# Patient Record
Sex: Male | Born: 1980 | Race: Black or African American | Hispanic: No | Marital: Single | Smoking: Former smoker
Health system: Southern US, Community
[De-identification: ages and names within clinical notes are randomized; demographics above are authoritative.]

## PROBLEM LIST (undated history)

## (undated) DIAGNOSIS — I10 Essential (primary) hypertension: Secondary | ICD-10-CM

## (undated) DIAGNOSIS — J309 Allergic rhinitis, unspecified: Secondary | ICD-10-CM

## (undated) DIAGNOSIS — F0781 Postconcussional syndrome: Secondary | ICD-10-CM

## (undated) DIAGNOSIS — G4730 Sleep apnea, unspecified: Secondary | ICD-10-CM

## (undated) DIAGNOSIS — J069 Acute upper respiratory infection, unspecified: Secondary | ICD-10-CM

## (undated) DIAGNOSIS — F32A Depression, unspecified: Secondary | ICD-10-CM

## (undated) DIAGNOSIS — F4310 Post-traumatic stress disorder, unspecified: Secondary | ICD-10-CM

## (undated) DIAGNOSIS — S069X9A Unspecified intracranial injury with loss of consciousness of unspecified duration, initial encounter: Secondary | ICD-10-CM

## (undated) DIAGNOSIS — G43909 Migraine, unspecified, not intractable, without status migrainosus: Secondary | ICD-10-CM

## (undated) DIAGNOSIS — S069XAA Unspecified intracranial injury with loss of consciousness status unknown, initial encounter: Secondary | ICD-10-CM

## (undated) DIAGNOSIS — H4900 Third [oculomotor] nerve palsy, unspecified eye: Secondary | ICD-10-CM

## (undated) HISTORY — DX: Depression, unspecified: F32.A

## (undated) HISTORY — DX: Essential (primary) hypertension: I10

## (undated) HISTORY — PX: SHOULDER ACROMIOPLASTY: SHX6093

## (undated) HISTORY — DX: Allergic rhinitis, unspecified: J30.9

## (undated) HISTORY — DX: Post-traumatic stress disorder, unspecified: F43.10

## (undated) HISTORY — DX: Sleep apnea, unspecified: G47.30

## (undated) HISTORY — DX: Acute upper respiratory infection, unspecified: J06.9

## (undated) HISTORY — DX: Third (oculomotor) nerve palsy, unspecified eye: H49.00

---

## 2008-04-16 ENCOUNTER — Emergency Department (HOSPITAL_COMMUNITY): Admission: EM | Admit: 2008-04-16 | Discharge: 2008-04-16 | Payer: Self-pay | Admitting: Emergency Medicine

## 2013-12-08 ENCOUNTER — Encounter (HOSPITAL_COMMUNITY): Payer: Self-pay | Admitting: Emergency Medicine

## 2013-12-08 ENCOUNTER — Emergency Department (HOSPITAL_COMMUNITY): Payer: Worker's Compensation

## 2013-12-08 ENCOUNTER — Emergency Department (HOSPITAL_COMMUNITY)
Admission: EM | Admit: 2013-12-08 | Discharge: 2013-12-08 | Discharge status: Against Medical Advice | Payer: Worker's Compensation | Attending: Emergency Medicine | Admitting: Emergency Medicine

## 2013-12-08 DIAGNOSIS — Z87891 Personal history of nicotine dependence: Secondary | ICD-10-CM | POA: Diagnosis not present

## 2013-12-08 DIAGNOSIS — Y92410 Unspecified street and highway as the place of occurrence of the external cause: Secondary | ICD-10-CM | POA: Diagnosis not present

## 2013-12-08 DIAGNOSIS — S8990XA Unspecified injury of unspecified lower leg, initial encounter: Secondary | ICD-10-CM | POA: Insufficient documentation

## 2013-12-08 DIAGNOSIS — S99919A Unspecified injury of unspecified ankle, initial encounter: Secondary | ICD-10-CM | POA: Diagnosis not present

## 2013-12-08 DIAGNOSIS — Y9389 Activity, other specified: Secondary | ICD-10-CM | POA: Insufficient documentation

## 2013-12-08 DIAGNOSIS — S99929A Unspecified injury of unspecified foot, initial encounter: Principal | ICD-10-CM

## 2013-12-08 MED ORDER — METOCLOPRAMIDE HCL 5 MG/ML IJ SOLN
10.0000 mg | Freq: Once | INTRAMUSCULAR | Status: AC
  Administered 2013-12-08: 10 mg via INTRAVENOUS
  Filled 2013-12-08: qty 2

## 2013-12-08 MED ORDER — HYDROMORPHONE HCL PF 1 MG/ML IJ SOLN
1.0000 mg | Freq: Once | INTRAMUSCULAR | Status: AC
  Administered 2013-12-08: 1 mg via INTRAVENOUS

## 2013-12-08 MED ORDER — ONDANSETRON HCL 4 MG/2ML IJ SOLN
4.0000 mg | Freq: Once | INTRAMUSCULAR | Status: AC
  Administered 2013-12-08: 4 mg via INTRAVENOUS
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
## 2013-12-08 NOTE — ED Notes (Signed)
Dr. Shelle IronBeane paged about d/c MRI and to ask which splint Dr. Eulah CitizenWould like pt to have put on until office follow up tomorrow.

## 2013-12-08 NOTE — ED Notes (Signed)
Dr. Shelle IronBeane at bedside, states that MRI can be d/c, pt ankle is stable. Recommends splint by ortho and crutches and to follow up with Beane, ortho tomorrow.
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
## 2014-01-19 DIAGNOSIS — G43909 Migraine, unspecified, not intractable, without status migrainosus: Secondary | ICD-10-CM | POA: Insufficient documentation

## 2014-03-31 ENCOUNTER — Encounter: Payer: Self-pay | Admitting: Podiatry

## 2014-03-31 ENCOUNTER — Ambulatory Visit (INDEPENDENT_AMBULATORY_CARE_PROVIDER_SITE_OTHER): Payer: 59 | Admitting: Podiatry

## 2014-03-31 VITALS — BP 128/84 | HR 68 | Resp 16 | Ht 67.0 in | Wt 200.0 lb

## 2014-03-31 DIAGNOSIS — L600 Ingrowing nail: Secondary | ICD-10-CM

## 2014-03-31 MED ORDER — NEOMYCIN-POLYMYXIN-HC 1 % OT SOLN: OTIC | Status: DC

## 2014-03-31 NOTE — Patient Instructions (Signed)

## 2014-03-31 NOTE — Progress Notes (Signed)
   Subjective:    Patient ID: Justin [name], male    DOB: 10/27/1980, 33 y.o.   MRN: [id number]  HPI Comments: "I have an ingrown toenail, I think"  Patient c/o tenderness 1st toe right, lateral border, for a couple days. The area has gradually gotten red and swollen and slight drainage. Sore with pressure. He has clipped out the nail some.  Toe Pain       Review of Systems  All other systems reviewed and are negative.      Objective:   Physical Exam: I have reviewed his past medical history medications allergies surgeries social history and review of systems. Pulses are strongly palpable right foot. Neurologic sensorium is intact per Semmes-Weinstein monofilament. Deep tendon reflexes are brisk and equal right foot. Muscle strength is 5 over 5 dorsiflexors plantar flexors inverters everters all intrinsic musculature is intact. Orthopedic evaluation demonstrates all joints distal to the ankle a full range of motion without crepitation. Cutaneous evaluation demonstrates a sharp incurvated nail margins with erythema and purulence to the fibular border of the hallux right. 6 was only tender on palpation.        Assessment & Plan:  Assessment: Ingrown nail paronychia abscess hallux right fibular border.  Plan: Discussed etiology pathology conservative versus surgical therapies. At this point we performed a chemical matrixectomy after local anesthesia was achieved. He tolerated procedure well with 3 applications of phenol to the fibular border and the exposed nail root. A dressed a compressive dressing was applied. He was given both oral and written home-going instructions for the care of maintenance of his surgical site. I will followup with him in one week. Should he have questions or concerns she will notify us immediately.

## 2014-04-21 ENCOUNTER — Ambulatory Visit (INDEPENDENT_AMBULATORY_CARE_PROVIDER_SITE_OTHER): Payer: 59 | Admitting: Podiatry

## 2014-04-21 ENCOUNTER — Encounter: Payer: Self-pay | Admitting: Podiatry

## 2014-04-21 ENCOUNTER — Ambulatory Visit: Payer: Self-pay | Admitting: Podiatry

## 2014-04-21 DIAGNOSIS — L600 Ingrowing nail: Secondary | ICD-10-CM

## 2014-04-21 NOTE — Progress Notes (Signed)
He presents today for followup of a matrixectomy fibular border hallux right. He denies fever chills nausea vomiting muscle aches and pains. Continues to soak in Betadine and water.  Objective: Vital signs are stable he is alert and oriented x3. There is no erythema edema cellulitis drainage or odor to the fibular border of the hallux right. It is nontender on palpation a small eschar is present.  Assessment: Well-healing surgical ingrown toenail deformity right foot.  Plan: Stop Betadine soaks start with Epsom salts in warm water twice daily apply Cortisporin as directed. Followup with me as needed.

## 2014-04-26 ENCOUNTER — Ambulatory Visit: Payer: Self-pay | Admitting: Podiatry

## 2015-02-20 ENCOUNTER — Emergency Department (HOSPITAL_BASED_OUTPATIENT_CLINIC_OR_DEPARTMENT_OTHER)
Admission: EM | Admit: 2015-02-20 | Discharge: 2015-02-20 | Disposition: A | Discharge status: Home / Self Care | Payer: Worker's Compensation | Attending: Emergency Medicine | Admitting: Emergency Medicine

## 2015-02-20 ENCOUNTER — Emergency Department (HOSPITAL_BASED_OUTPATIENT_CLINIC_OR_DEPARTMENT_OTHER): Payer: Worker's Compensation

## 2015-02-20 ENCOUNTER — Encounter (HOSPITAL_BASED_OUTPATIENT_CLINIC_OR_DEPARTMENT_OTHER): Payer: Self-pay | Admitting: *Deleted

## 2015-02-20 DIAGNOSIS — W1839XA Other fall on same level, initial encounter: Secondary | ICD-10-CM | POA: Insufficient documentation

## 2015-02-20 DIAGNOSIS — Y9389 Activity, other specified: Secondary | ICD-10-CM | POA: Diagnosis not present

## 2015-02-20 DIAGNOSIS — S4992XA Unspecified injury of left shoulder and upper arm, initial encounter: Secondary | ICD-10-CM | POA: Diagnosis present

## 2015-02-20 DIAGNOSIS — Y9289 Other specified places as the place of occurrence of the external cause: Secondary | ICD-10-CM | POA: Insufficient documentation

## 2015-02-20 DIAGNOSIS — Z87891 Personal history of nicotine dependence: Secondary | ICD-10-CM | POA: Diagnosis not present

## 2015-02-20 DIAGNOSIS — Y998 Other external cause status: Secondary | ICD-10-CM | POA: Insufficient documentation

## 2015-02-20 DIAGNOSIS — S46912A Strain of unspecified muscle, fascia and tendon at shoulder and upper arm level, left arm, initial encounter: Secondary | ICD-10-CM | POA: Insufficient documentation

## 2015-02-20 MED ORDER — NAPROXEN 500 MG PO TABS: 500.0000 mg | ORAL_TABLET | Freq: Two times a day (BID) | ORAL | Status: DC

## 2015-02-20 MED ORDER — KETOROLAC TROMETHAMINE 60 MG/2ML IM SOLN
60.0000 mg | Freq: Once | INTRAMUSCULAR | Status: AC
  Administered 2015-02-20: 60 mg via INTRAMUSCULAR
  Filled 2015-02-20: qty 2

## 2015-02-20 MED ORDER — TRAMADOL HCL 50 MG PO TABS: 50.0000 mg | ORAL_TABLET | Freq: Four times a day (QID) | ORAL | Status: DC | PRN

## 2015-02-20 NOTE — ED Notes (Signed)
Patient transported to X-ray 
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
## 2015-02-20 NOTE — Discharge Instructions (Signed)
Ice your shoulder several times a day. Keep in the sling for comfort and support. Naprosyn for pain and inflammation. Ultram for severe pain. Follow up with orthopedics specialist.   Shoulder Pain The shoulder is the joint that connects your arms to your body. The bones that form the shoulder joint include the upper arm bone (humerus), the shoulder blade (scapula), and the collarbone (clavicle). The top of the humerus is shaped like a ball and fits into a rather flat socket on the scapula (glenoid cavity). A combination of muscles and strong, fibrous tissues that connect muscles to bones (tendons) support your shoulder joint and hold the ball in the socket. Small, fluid-filled sacs (bursae) are located in different areas of the joint. They act as cushions between the bones and the overlying soft tissues and help reduce friction between the gliding tendons and the bone as you move your arm. Your shoulder joint allows a wide range of motion in your arm. This range of motion allows you to do things like scratch your back or throw a ball. However, this range of motion also makes your shoulder more prone to pain from overuse and injury. Causes of shoulder pain can originate from both injury and overuse and usually can be grouped in the following four categories:  Redness, swelling, and pain (inflammation) of the tendon (tendinitis) or the bursae (bursitis).  Instability, such as a dislocation of the joint.  Inflammation of the joint (arthritis).  Broken bone (fracture). HOME CARE INSTRUCTIONS   Apply ice to the sore area.  Put ice in a plastic bag.  Place a towel between your skin and the bag.  Leave the ice on for 15-20 minutes, 3-4 times per day for the first 2 days, or as directed by your health care provider.  Stop using cold packs if they do not help with the pain.  If you have a shoulder sling or immobilizer, wear it as long as your caregiver instructs. Only remove it to shower or bathe.  Move your arm as little as possible, but keep your hand moving to prevent swelling.  Squeeze a soft ball or foam pad as much as possible to help prevent swelling.  Only take over-the-counter or prescription medicines for pain, discomfort, or fever as directed by your caregiver. SEEK MEDICAL CARE IF:   Your shoulder pain increases, or new pain develops in your arm, hand, or fingers.  Your hand or fingers become cold and numb.  Your pain is not relieved with medicines. SEEK IMMEDIATE MEDICAL CARE IF:   Your arm, hand, or fingers are numb or tingling.  Your arm, hand, or fingers are significantly swollen or turn white or blue. MAKE SURE YOU:   Understand these instructions.  Will watch your condition.  Will get help right away if you are not doing well or get worse. Document Released: 06/19/2005 Document Revised: 01/24/2014 Document Reviewed: 08/24/2011 [facility] Patient Information 2015 [employer] [place]. This information is not intended to replace advice given to you by your health care provider. Make sure you discuss any questions you have with your health care provider.
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
## 2015-05-30 ENCOUNTER — Emergency Department (HOSPITAL_BASED_OUTPATIENT_CLINIC_OR_DEPARTMENT_OTHER)
Admission: EM | Admit: 2015-05-30 | Discharge: 2015-05-30 | Disposition: A | Discharge status: Home / Self Care | Payer: Non-veteran care | Attending: Emergency Medicine | Admitting: Emergency Medicine

## 2015-05-30 ENCOUNTER — Encounter (HOSPITAL_BASED_OUTPATIENT_CLINIC_OR_DEPARTMENT_OTHER): Payer: Self-pay | Admitting: *Deleted

## 2015-05-30 DIAGNOSIS — G43009 Migraine without aura, not intractable, without status migrainosus: Secondary | ICD-10-CM | POA: Insufficient documentation

## 2015-05-30 DIAGNOSIS — Z791 Long term (current) use of non-steroidal anti-inflammatories (NSAID): Secondary | ICD-10-CM | POA: Insufficient documentation

## 2015-05-30 DIAGNOSIS — Z87891 Personal history of nicotine dependence: Secondary | ICD-10-CM | POA: Insufficient documentation

## 2015-05-30 DIAGNOSIS — R51 Headache: Secondary | ICD-10-CM | POA: Diagnosis present

## 2015-05-30 HISTORY — DX: Migraine, unspecified, not intractable, without status migrainosus: G43.909

## 2015-05-30 MED ORDER — DEXAMETHASONE SODIUM PHOSPHATE 10 MG/ML IJ SOLN
10.0000 mg | Freq: Once | INTRAMUSCULAR | Status: AC
  Administered 2015-05-30: 10 mg via INTRAVENOUS
  Filled 2015-05-30: qty 1

## 2015-05-30 MED ORDER — PROCHLORPERAZINE EDISYLATE 5 MG/ML IJ SOLN
10.0000 mg | Freq: Four times a day (QID) | INTRAMUSCULAR | Status: DC | PRN
  Administered 2015-05-30: 10 mg via INTRAVENOUS
  Filled 2015-05-30: qty 2

## 2015-05-30 NOTE — ED Notes (Addendum)
Patient states he has a history of migraines.  States this headache has lasted for the last seven days.  Has taken Excedrin migraine with no relief.  Today he took Ibuprofen  with no relief.   Headache has been associated with vomiting last night.

## 2015-05-30 NOTE — Discharge Instructions (Signed)
Recurrent Migraine Headache A migraine headache is an intense, throbbing pain on one or both sides of your head. Recurrent migraines keep coming back. A migraine can last for 30 minutes to several hours. CAUSES  The exact cause of a migraine headache is not always known. However, a migraine may be caused when nerves in the brain become irritated and release chemicals that cause inflammation. This causes pain. Certain things may also trigger migraines, such as:   Alcohol.  Smoking.  Stress.  Menstruation.  Aged cheeses.  Foods or drinks that contain nitrates, glutamate, aspartame, or tyramine.  Lack of sleep.  Chocolate.  Caffeine.  Hunger.  Physical exertion.  Fatigue.  Medicines used to treat chest pain (nitroglycerine), birth control pills, estrogen, and some blood pressure medicines. SYMPTOMS   Pain on one or both sides of your head.  Pulsating or throbbing pain.  Severe pain that prevents daily activities.  Pain that is aggravated by any physical activity.  Nausea, vomiting, or both.  Dizziness.  Pain with exposure to bright lights, loud noises, or activity.  General sensitivity to bright lights, loud noises, or smells. Before you get a migraine, you may get warning signs that a migraine is coming (aura). An aura may include:  Seeing flashing lights.  Seeing bright spots, halos, or zigzag lines.  Having tunnel vision or blurred vision.  Having feelings of numbness or tingling.  Having trouble talking.  Having muscle weakness. DIAGNOSIS  A recurrent migraine headache is often diagnosed based on:  Symptoms.  Physical examination.  A CT scan or MRI of your head. These imaging tests cannot diagnose migraines but can help rule out other causes of headaches.  TREATMENT  Medicines may be given for pain and nausea. Medicines can also be given to help prevent recurrent migraines. HOME CARE INSTRUCTIONS  Only take over-the-counter or prescription  medicines for pain or discomfort as directed by your health care provider. The use of long-term narcotics is not recommended.  Lie down in a dark, quiet room when you have a migraine.  Keep a journal to find out what may trigger your migraine headaches. For example, write down:  What you eat and drink.  How much sleep you get.  Any change to your diet or medicines.  Limit alcohol consumption.  Quit smoking if you smoke.  Get 7-9 hours of sleep, or as recommended by your health care provider.  Limit stress.  Keep lights dim if bright lights bother you and make your migraines worse. SEEK MEDICAL CARE IF:   You do not get relief from the medicines given to you.  You have a recurrence of pain.  You have a fever. SEEK IMMEDIATE MEDICAL CARE IF:  Your migraine becomes severe.  You have a stiff neck.  You have loss of vision.  You have muscular weakness or loss of muscle control.  You start losing your balance or have trouble walking.  You feel faint or pass out.  You have severe symptoms that are different from your first symptoms. MAKE SURE YOU:   Understand these instructions.  Will watch your condition.  Will get help right away if you are not doing well or get worse. Document Released: 06/04/2001 Document Revised: 01/24/2014 Document Reviewed: 05/17/2013 Exit[employer] Patient Information 2015 [employer]. This information is not intended to replace advice given to you by your health care provider. Make sure you discuss any questions you have with your health care provider.  

## 2015-05-30 NOTE — ED Provider Notes (Addendum)
CSN: [id number]     Arrival date & time 05/30/15  0919 History   First MD Initiated Contact with Patient 05/30/15 1010     Chief Complaint  Patient presents with  . Migraine     (Consider location/radiation/quality/duration/timing/severity/associated sxs/prior Treatment) Patient is a 34 y.o. male presenting with migraines.  Migraine This is a new problem. Episode onset: 1 week. The problem occurs constantly. The problem has not changed since onset.Associated symptoms include headaches (from back of head to right, hx of TBI when in [place], better with massage, throbbing and sharp). Pertinent negatives include no chest pain, no abdominal pain and no shortness of breath. Exacerbated by: bright lights. Treatments tried: ibuprofen, excedrin migraine. The treatment provided no relief.    Past Medical History  Diagnosis Date  . Migraine    Past Surgical History  Procedure Laterality Date  . Shoulder acromioplasty     No family history on file. Social History  Substance Use Topics  . Smoking status: Former Games developer  . Smokeless tobacco: None  . Alcohol Use: Yes     Comment: social    Review of Systems  Constitutional: Negative for fever.  HENT: Negative for sore throat.   Eyes: Positive for photophobia and visual disturbance.  Respiratory: Negative for shortness of breath.   Cardiovascular: Negative for chest pain.  Gastrointestinal: Positive for nausea and vomiting. Negative for abdominal pain, diarrhea and constipation.  Genitourinary: Negative for difficulty urinating.  Musculoskeletal: Negative for back pain and neck stiffness.  Skin: Negative for rash.  Neurological: Positive for headaches (from back of head to right, hx of TBI when in [place], better with massage, throbbing and sharp). Negative for dizziness, tremors, syncope, facial asymmetry, speech difficulty, weakness, light-headedness and numbness.      Allergies  Shrimp and Iodine  Home Medications   Prior to Admission  medications   Medication Sig Start Date End Date Taking? Authorizing Provider  aspirin-acetaminophen-caffeine (EXCEDRIN MIGRAINE) [phone] MG per tablet Take 2 tablets by mouth every 6 (six) hours as needed for headache.   Yes Historical Provider, MD  naproxen (NAPROSYN) 500 MG tablet Take 1 tablet (500 mg total) by mouth 2 (two) times daily. 02/20/15   Tatyana Kirichenko, PA-C  NEOMYCIN-POLYMYXIN-HYDROCORTISONE (CORTISPORIN) 1 % SOLN otic solution Apply 1-2 drops to the toe after soaking BID 03/31/14   Max T Hyatt, DPM  traMADol (ULTRAM) 50 MG tablet Take 1 tablet (50 mg total) by mouth every 6 (six) hours as needed. 02/20/15   Tatyana Kirichenko, PA-C   BP 118/80 mmHg  Pulse 68  Temp(Src) 98.1 F (36.7 C) (Oral)  Resp 20  Ht 5\' 8"  (1.727 m)  Wt 200 lb (90.719 kg)  BMI 30.42 kg/m2  SpO2 100% Physical Exam  Constitutional: He is oriented to person, place, and time. He appears well-developed and well-nourished. No distress.  HENT:  Head: Normocephalic and atraumatic.  Eyes: Conjunctivae and EOM are normal.  Neck: Normal range of motion.  Cardiovascular: Normal rate, regular rhythm, normal heart sounds and intact distal pulses.  Exam reveals no gallop and no friction rub.   No murmur heard. Pulmonary/Chest: Effort normal and breath sounds normal. No respiratory distress. He has no wheezes. He has no rales.  Abdominal: Soft. He exhibits no distension. There is no tenderness. There is no guarding.  Musculoskeletal: He exhibits no edema.  Neurological: He is alert and oriented to person, place, and time. He has normal strength. No cranial nerve deficit or sensory deficit. Coordination and gait normal. GCS  eye subscore is 4. GCS verbal subscore is 5. GCS motor subscore is 6.  Skin: Skin is warm and dry. He is not diaphoretic.  Nursing note and vitals reviewed.   ED Course  Procedures (including critical care time) Labs Review Labs Reviewed - No data to display  Imaging Review No results  found. I have personally reviewed and evaluated these images and lab results as part of my medical decision-making.   EKG Interpretation None      MDM   Final diagnoses:  Migraine without aura and without status migrainosus, not intractable   34yo male with history of migraines presents with concern of headache.  Headache began slowly, no trauma, no fevers, and normal neurologic exam and have low suspicion for [facility], SDH or meningitis.  Patient was given compazine and decadron with improvement in headache.  Patient discharged in stable condition with understanding of reasons to return.    Alvira Monday, MD 05/30/15 [phone]

## 2016-07-18 ENCOUNTER — Emergency Department (HOSPITAL_BASED_OUTPATIENT_CLINIC_OR_DEPARTMENT_OTHER): Payer: Non-veteran care

## 2016-07-18 ENCOUNTER — Emergency Department (HOSPITAL_BASED_OUTPATIENT_CLINIC_OR_DEPARTMENT_OTHER)
Admission: EM | Admit: 2016-07-18 | Discharge: 2016-07-18 | Disposition: A | Discharge status: Home / Self Care | Payer: Non-veteran care | Attending: Emergency Medicine | Admitting: Emergency Medicine

## 2016-07-18 ENCOUNTER — Encounter (HOSPITAL_BASED_OUTPATIENT_CLINIC_OR_DEPARTMENT_OTHER): Payer: Self-pay

## 2016-07-18 DIAGNOSIS — Z7982 Long term (current) use of aspirin: Secondary | ICD-10-CM | POA: Insufficient documentation

## 2016-07-18 DIAGNOSIS — G43809 Other migraine, not intractable, without status migrainosus: Secondary | ICD-10-CM | POA: Diagnosis not present

## 2016-07-18 DIAGNOSIS — G43909 Migraine, unspecified, not intractable, without status migrainosus: Secondary | ICD-10-CM | POA: Diagnosis present

## 2016-07-18 HISTORY — DX: Unspecified intracranial injury with loss of consciousness of unspecified duration, initial encounter: S06.9X9A

## 2016-07-18 HISTORY — DX: Unspecified intracranial injury with loss of consciousness status unknown, initial encounter: S06.9XAA

## 2016-07-18 HISTORY — DX: Postconcussional syndrome: F07.81

## 2016-07-18 LAB — CBC WITH DIFFERENTIAL/PLATELET
BASOS ABS: 0 10*3/uL (ref 0.0–0.1)
Basophils Relative: 0 %
EOS PCT: 1 %
Eosinophils Absolute: 0.1 10*3/uL (ref 0.0–0.7)
HEMATOCRIT: 43.6 % (ref 39.0–52.0)
Hemoglobin: 15.1 g/dL (ref 13.0–17.0)
LYMPHS ABS: 1.4 10*3/uL (ref 0.7–4.0)
LYMPHS PCT: 22 %
MCH: 31 pg (ref 26.0–34.0)
MCHC: 34.6 g/dL (ref 30.0–36.0)
MCV: 89.5 fL (ref 78.0–100.0)
MONO ABS: 0.3 10*3/uL (ref 0.1–1.0)
MONOS PCT: 4 %
NEUTROS ABS: 4.6 10*3/uL (ref 1.7–7.7)
Neutrophils Relative %: 73 %
PLATELETS: 335 10*3/uL (ref 150–400)
RBC: 4.87 MIL/uL (ref 4.22–5.81)
RDW: 13 % (ref 11.5–15.5)
WBC: 6.3 10*3/uL (ref 4.0–10.5)

## 2016-07-18 LAB — BASIC METABOLIC PANEL
ANION GAP: 7 (ref 5–15)
BUN: 16 mg/dL (ref 6–20)
CALCIUM: 9.3 mg/dL (ref 8.9–10.3)
CO2: 28 mmol/L (ref 22–32)
Chloride: 103 mmol/L (ref 101–111)
Creatinine, Ser: 1.31 mg/dL — ABNORMAL HIGH (ref 0.61–1.24)
GFR calc Af Amer: >60 mL/min (ref >60)
GLUCOSE: 105 mg/dL — AB (ref 65–99)
Potassium: 3.7 mmol/L (ref 3.5–5.1)
Sodium: 138 mmol/L (ref 135–145)

## 2016-07-18 MED ORDER — SODIUM CHLORIDE 0.9 % IV BOLUS (SEPSIS)
1000.0000 mL | Freq: Once | INTRAVENOUS | Status: AC
  Administered 2016-07-18: 1000 mL via INTRAVENOUS

## 2016-07-18 MED ORDER — PROCHLORPERAZINE EDISYLATE 5 MG/ML IJ SOLN
10.0000 mg | Freq: Once | INTRAMUSCULAR | Status: AC
Start: 2016-07-18 — End: 2016-07-18
  Administered 2016-07-18: 10 mg via INTRAVENOUS
  Filled 2016-07-18: qty 2

## 2016-07-18 MED ORDER — KETOROLAC TROMETHAMINE 30 MG/ML IJ SOLN
30.0000 mg | Freq: Once | INTRAMUSCULAR | Status: AC
  Administered 2016-07-18: 30 mg via INTRAVENOUS
  Filled 2016-07-18: qty 1

## 2016-07-18 MED ORDER — MAGNESIUM SULFATE 2 GM/50ML IV SOLN
2.0000 g | Freq: Once | INTRAVENOUS | Status: AC
  Administered 2016-07-18: 2 g via INTRAVENOUS
  Filled 2016-07-18: qty 50

## 2016-07-18 MED ORDER — SUMATRIPTAN SUCCINATE 6 MG/0.5ML ~~LOC~~ SOLN: 6.0000 mg | Freq: Once | SUBCUTANEOUS | Status: DC

## 2016-07-18 MED ORDER — METHYLPREDNISOLONE SODIUM SUCC 125 MG IJ SOLR
125.0000 mg | Freq: Once | INTRAMUSCULAR | Status: AC
  Administered 2016-07-18: 125 mg via INTRAVENOUS
  Filled 2016-07-18: qty 2

## 2016-07-18 MED ORDER — BUPIVACAINE HCL 0.5 % IJ SOLN
10.0000 mL | Freq: Once | INTRAMUSCULAR | Status: AC
  Administered 2016-07-18: 10 mL
  Filled 2016-07-18: qty 1

## 2016-07-18 NOTE — ED Triage Notes (Signed)
C/o intermittent migraine x 72 hours-NAD-steady gait

## 2016-07-18 NOTE — ED Provider Notes (Signed)
[facility]   CSN: [id number] Arrival date & time: 07/18/16  1123     History   Chief Complaint Chief Complaint  Patient presents with  . Migraine    HPI  Blood pressure 128/73, pulse 89, temperature 98.7 F (37.1 C), temperature source Oral, resp. rate 16, height 5\' 8"  (1.727 m), weight 104.3 kg, SpO2 100 %.  Justin [name] is a 35 y.o. male with past medical history significant for migraine status post TBI, he is followed at the [facility] for this. He started having a migraine exacerbation onset 7 days ago (note that this contradicts triage note) it's been intermittent, not alleviated by his Thorazine and Excedrin. It is right periorbital, typical in location and severity but atypical in the duration of his symptoms for 7 days. He has had intermittent relief and that time. Positive photosensitivity and 4 episodes of emesis at the onset of headache which is not atypical for him. He also has associated Mnire's disease with tinnitus and balance problems that are not exacerbated over the last week. Pt denies fever, rash, confusion, cervicalgia, LOC/syncope, change in vision, N/V, numbness, weakness, dysarthria, ataxia, thunderclap onset, exacerbation with exertion or valsalva, exacerbation in morning, CP, SOB, abdominal pain.   Past Medical History:  Diagnosis Date  . Migraine   . Postconcussion syndrome   . TBI (traumatic brain injury) (HCC)     There are no active problems to display for this patient.   Past Surgical History:  Procedure Laterality Date  . SHOULDER ACROMIOPLASTY         Home Medications    Prior to Admission medications   Medication Sig Start Date End Date Taking? Authorizing Provider  aspirin-acetaminophen-caffeine (EXCEDRIN MIGRAINE) [phone] MG per tablet Take 2 tablets by mouth every 6 (six) hours as needed for headache.    Historical Provider, MD    Family History No family history on file.  Social History Social History    Substance Use Topics  . Smoking status: Never Smoker  . Smokeless tobacco: Never Used  . Alcohol use No     Allergies   Shrimp [shellfish allergy] and Iodine   Review of Systems Review of Systems   Physical Exam Updated Vital Signs BP 117/63   Pulse 68   Temp 98.7 F (37.1 C) (Oral)   Resp 18   Ht 5\' 8"  (1.727 m)   Wt 104.3 kg   SpO2 98%   BMI 34.97 kg/m   Physical Exam  Constitutional: He is oriented to person, place, and time. He appears well-developed and well-nourished.  sensitive to light  HENT:  Head: Normocephalic and atraumatic.  Mouth/Throat: Oropharynx is clear and moist.  Eyes: Conjunctivae and EOM are normal. Pupils are equal, round, and reactive to light.  No TTP of maxillary or frontal sinuses  No TTP or induration of temporal arteries bilaterally  Neck: Normal range of motion. Neck supple.  FROM to C-spine. Pt can touch chin to chest without discomfort. No TTP of midline cervical spine.   Cardiovascular: Normal rate, regular rhythm and intact distal pulses.   Pulmonary/Chest: Effort normal and breath sounds normal. No respiratory distress. He has no wheezes. He has no rales. He exhibits no tenderness.  Abdominal: Soft. Bowel sounds are normal. There is no tenderness.  Musculoskeletal: Normal range of motion. He exhibits no edema or tenderness.  Neurological: He is alert and oriented to person, place, and time. No cranial nerve deficit.  II-Visual fields grossly intact. III/IV/VI-Extraocular movements intact.  Pupils reactive bilaterally. V/VII-Smile symmetric, equal eyebrow raise,  facial sensation intact VIII- Hearing grossly intact IX/X-Normal gag XI-bilateral shoulder shrug XII-midline tongue extension Motor: 5/5 bilaterally with normal tone and bulk Cerebellar: Normal finger-to-nose  and normal heel-to-shin test.   Romberg negative Ambulates with a coordinated gait   Nursing note and vitals reviewed.    ED Treatments / Results   Labs (all labs ordered are listed, but only abnormal results are displayed) Labs Reviewed  BASIC METABOLIC PANEL - Abnormal; Notable for the following:       Result Value   Glucose, Bld 105 (*)    Creatinine, Ser 1.31 (*)    All other components within normal limits  CBC WITH DIFFERENTIAL/PLATELET    EKG  EKG Interpretation None       Radiology Ct Head Wo Contrast  Result Date: 07/18/2016 CLINICAL DATA:  Intermittent migraine for 7 days EXAM: CT HEAD WITHOUT CONTRAST TECHNIQUE: Contiguous axial images were obtained from the base of the skull through the vertex without intravenous contrast. COMPARISON:  None. FINDINGS: Brain: No evidence of acute infarction, hemorrhage, hydrocephalus, extra-axial collection or mass lesion/mass effect. Vascular: No hyperdense vessel or unexpected calcification. Skull: Normal. Negative for fracture or focal lesion. Sinuses/Orbits: No acute finding. Other: None. IMPRESSION: No acute intracranial abnormality. No definite acute cortical infarction. No hydrocephalus. Electronically Signed   By: Natasha Mead M.D.   On: 07/18/2016 13:18    Procedures .Nerve Block Date/Time: 07/18/2016 2:53 PM Performed by: Wynetta Emery Authorized by: Wynetta Emery   Consent:    Consent obtained:  Verbal   Consent given by:  Patient Indications:    Indications:  Pain relief Location:    Body area:  Head   Head nerve blocked: sphenopalatine.   Laterality:  Right Procedure details (see MAR for exact dosages):    Needle gauge: no needle used.   Anesthetic injected:  Bupivacaine 0.5% w/o epi   Steroid injected:  None   Additive injected:  None   Block injection procedure: cotton-tipped swab inserted into the right nares.   Paresthesia:  None Post-procedure details:    Dressing:  None   Patient tolerance of procedure:  Tolerated well, no immediate complications   (including critical care time)  Medications Ordered in ED Medications  sodium chloride  0.9 % bolus 1,000 mL (0 mLs Intravenous Stopped 07/18/16 1345)  methylPREDNISolone sodium succinate (SOLU-MEDROL) 125 mg/2 mL injection 125 mg (125 mg Intravenous Given 07/18/16 1258)  prochlorperazine (COMPAZINE) injection 10 mg (10 mg Intravenous Given 07/18/16 1300)  ketorolac (TORADOL) 30 MG/ML injection 30 mg (30 mg Intravenous Given 07/18/16 1342)  magnesium sulfate IVPB 2 g 50 mL (2 g Intravenous New Bag/Given 07/18/16 1420)  bupivacaine (MARCAINE) 0.5 % (with pres) injection 10 mL (10 mLs Infiltration Given 07/18/16 1433)     Initial Impression / Assessment and Plan / ED Course  I have reviewed the triage vital signs and the nursing notes.  Pertinent labs & imaging results that were available during my care of the patient were reviewed by me and considered in my medical decision making (see chart for details).  Clinical Course    Vitals:   07/18/16 1133 07/18/16 1323  BP: 128/73 117/63  Pulse: 89 68  Resp: 16 18  Temp: 98.7 F (37.1 C)   TempSrc: Oral   SpO2: 100% 98%  Weight: 104.3 kg   Height: 5\' 8"  (1.727 m)     Medications  sodium chloride 0.9 % bolus 1,000 mL (0 mLs Intravenous  Stopped 07/18/16 1345)  methylPREDNISolone sodium succinate (SOLU-MEDROL) 125 mg/2 mL injection 125 mg (125 mg Intravenous Given 07/18/16 1258)  prochlorperazine (COMPAZINE) injection 10 mg (10 mg Intravenous Given 07/18/16 1300)  ketorolac (TORADOL) 30 MG/ML injection 30 mg (30 mg Intravenous Given 07/18/16 1342)  magnesium sulfate IVPB 2 g 50 mL (2 g Intravenous New Bag/Given 07/18/16 1420)  bupivacaine (MARCAINE) 0.5 % (with pres) injection 10 mL (10 mLs Infiltration Given 07/18/16 1433)    Justin [name] is 35 y.o. male presenting with exacerbation of chronic migraine status post TBI. He states that this is typical for him in location and severity however only does not last this long. Patient afebrile, overall well-appearing. Neurologic exam is nonfocal, no red flags for subarachnoid. Given  the length of time he's had his headaches will obtain CT and give headache cocktail.  CT negative, headache cocktail with some improvement, rates his headache at 4 out of 10. Will give 2 g of magnesium and sphenopalatine block.  Headache significantly improved. He rates it 2 out of 10. I've offered him narcotics for rescue but he declines. Will follow with his neurologist in the week.  Evaluation does not show pathology that would require ongoing emergent intervention or inpatient treatment. Pt is hemodynamically stable and mentating appropriately. Discussed findings and plan with patient/guardian, who agrees with care plan. All questions answered. Return precautions discussed and outpatient follow up given.      Final Clinical Impressions(s) / ED Diagnoses   Final diagnoses:  Other migraine without status migrainosus, not intractable    New Prescriptions New Prescriptions   No medications on file     Wynetta Emery, PA-C 07/18/16 1535    Maia Plan, MD 07/18/16 [phone]

## 2016-07-18 NOTE — ED Notes (Signed)
Patient transported to CT 

## 2016-07-18 NOTE — Discharge Instructions (Signed)
Rest and stay well hydrated. Please follow with your neurologist in the next week.  Do not hesitate to return to the emergency room for a new, worsening or concerning symptoms.

## 2016-09-25 DIAGNOSIS — F4310 Post-traumatic stress disorder, unspecified: Secondary | ICD-10-CM | POA: Insufficient documentation

## 2016-09-25 DIAGNOSIS — G44309 Post-traumatic headache, unspecified, not intractable: Secondary | ICD-10-CM | POA: Insufficient documentation

## 2020-04-27 ENCOUNTER — Other Ambulatory Visit: Payer: Self-pay

## 2020-04-27 ENCOUNTER — Encounter: Payer: Self-pay | Admitting: Allergy and Immunology

## 2020-04-27 ENCOUNTER — Ambulatory Visit (INDEPENDENT_AMBULATORY_CARE_PROVIDER_SITE_OTHER): Payer: No Typology Code available for payment source | Admitting: Allergy and Immunology

## 2020-04-27 VITALS — BP 120/80 | HR 78 | Temp 98.4°F | Resp 16 | Ht 66.69 in | Wt 237.4 lb

## 2020-04-27 DIAGNOSIS — H1013 Acute atopic conjunctivitis, bilateral: Secondary | ICD-10-CM | POA: Diagnosis not present

## 2020-04-27 DIAGNOSIS — H1010 Acute atopic conjunctivitis, unspecified eye: Secondary | ICD-10-CM | POA: Insufficient documentation

## 2020-04-27 DIAGNOSIS — T7800XA Anaphylactic reaction due to unspecified food, initial encounter: Secondary | ICD-10-CM

## 2020-04-27 DIAGNOSIS — T781XXA Other adverse food reactions, not elsewhere classified, initial encounter: Secondary | ICD-10-CM | POA: Insufficient documentation

## 2020-04-27 DIAGNOSIS — T781XXD Other adverse food reactions, not elsewhere classified, subsequent encounter: Secondary | ICD-10-CM

## 2020-04-27 DIAGNOSIS — J3089 Other allergic rhinitis: Secondary | ICD-10-CM | POA: Insufficient documentation

## 2020-04-27 HISTORY — DX: Other allergic rhinitis: J30.89

## 2020-04-27 MED ORDER — EPINEPHRINE 0.15 MG/0.3ML IJ SOAJ: INTRAMUSCULAR | 3 refills | Status: DC

## 2020-04-27 NOTE — Assessment & Plan Note (Addendum)
   Treatment plan as outlined above for allergic rhinitis.  Ketotifen (Zaditor) 1 drop per eye twice daily as needed.  I have also recommended eye lubricant drops (i.e., Natural Tears) as needed.

## 2020-04-27 NOTE — Assessment & Plan Note (Signed)
The patient's history suggests shellfish allergy and positive skin test results today confirm this diagnosis.  Careful avoidance of shellfish as discussed.  A prescription has been provided for epinephrine auto-injector 2 pack along with instructions for proper administration.  A food allergy action plan has been provided and discussed.  Medic Alert identification is recommended.

## 2020-04-27 NOTE — Progress Notes (Signed)
New Patient Note  RE: Justin [name] MRN: [id number] DOB: [birth year] Date of Office Visit: 04/27/2020  Referring provider: Pecolia Ades, MD Primary care provider: Cleta Alberts, MD  Chief Complaint: Allergic Rhinitis  and Allergic Reaction   History of present illness: Justin [name] is a 39 y.o. male seen today in consultation requested by Pecolia Ades, MD.  He experiences nasal congestion, rhinorrhea, sneezing, postnasal drainage, nasal pruritus, and ocular pruritus.  The symptoms occur year-round but are most frequent and severe with pollen exposure, particularly springtime.  Reports that if he has not taken antihistamines and mows the lawn or touches his dog or is licked by his dog, he develops small hives on his arms.  He does not experience concomitant angioedema, cardiopulmonary symptoms, or GI symptoms. Justin [name] experiences oral pruritus with the consumption of fresh/raw apples, cantaloupe and, to a lesser extent, watermelon.  He does not experience concomitant urticaria, angioedema, cardiopulmonary symptoms, or other GI symptoms.  If the food is cooked, such as apple pie, he does not experience the oral pruritus. Justin [name] reports that when he was 22 or 39 years old he experienced lip swelling and a burning sensation of the lips when he consumed shrimp or crab.  He avoided shellfish for many years.  He states that he currently consumes some shellfish on occasion, but only while taking an antihistamine as the antihistamine seems to minimize any symptoms.   He has no history of symptoms consistent with asthma or atopic dermatitis.  Assessment and plan: Seasonal and perennial allergic rhinitis  Aeroallergen avoidance measures have been discussed and provided in written form.  Cetirizine 10 mg daily as needed.  To avoid diminishing benefit with daily use (tachyphylaxis) of second generation antihistamine, consider alternating every few months between fexofenadine (Allegra) and  cetirizine (Zyrtec).  For now, continue montelukast (Singulair) 10 mg daily at bedtime.  Ipratropium 0.3% nasal spray, 2 sprays per nostril twice daily as needed.  Fluticasone nasal spray, 1-2 sprays per nostril daily as needed.  Nasal saline spray (i.e., Simply Saline) or nasal saline lavage (i.e., NeilMed) is recommended as needed and prior to medicated nasal sprays.  The risks and benefits of aeroallergen immunotherapy have been discussed. The patient is motivated to initiate immunotherapy to reduce symptoms and decrease medication requirement. Informed consent has been signed and allergen vaccine orders have been submitted. Medications will be decreased or discontinued as symptom relief from immunotherapy becomes evident.  Allergic conjunctivitis  Treatment plan as outlined above for allergic rhinitis.  Ketotifen (Zaditor) 1 drop per eye twice daily as needed.  I have also recommended eye lubricant drops (i.e., Natural Tears) as needed.  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 Peeling or cooking the food has shown to reduce symptoms and antihistamines may also relieve symptoms. Immunotherapy to the cross reacting pollens has improved or cured PFAS in many patients, though this has not been consistent for all patients. Typically PFAS is limited to itching or swelling of mucosal tissues from the lips to the back of the throat.   Information about PFAS has been discussed and provided in written form.  All foods causing symptoms are to be avoided.  Should symptoms progress beyond the mouth and throat, 911 is to be called immediately.  Food allergy The patient's history suggests shellfish allergy and positive skin test results today confirm this diagnosis.  Careful avoidance of shellfish as discussed.  A prescription has been provided for  epinephrine auto-injector 2 pack along with instructions  for proper administration.  A food allergy action plan has been provided and discussed.  Medic Alert identification is recommended.   Meds ordered this encounter  Medications  . EPINEPHrine (EPIPEN JR 2-PAK) 0.15 MG/0.3ML injection    Sig: Use as directed for severe allergic reactions    Dispense:  4 each    Refill:  3    Dispense Mylan generic only    Diagnostics: Environmental skin testing: Robust reactivity to grass pollen, tree pollen, ragweed pollen, weed pollen, and dust mite antigen.  Also reactive to cat hair and dog epithelia.  Borderline/equivocal reactivity to molds. Food allergen skin testing: Reactivity to shrimp, crab, lobster, apple, cantaloupe, and watermelon.    Physical examination: Blood pressure 120/80, pulse 78, temperature 98.4 F (36.9 C), temperature source Oral, resp. rate 16, height 5' 6.69" (1.694 m), weight 237 lb 6.4 oz (107.7 kg), SpO2 98 %.  General: Alert, interactive, in no acute distress. HEENT: TMs pearly gray, turbinates edematous with clear discharge, post-pharynx erythematous. Neck: Supple without lymphadenopathy. Lungs: Clear to auscultation without wheezing, rhonchi or rales. CV: Normal S1, S2 without murmurs. Abdomen: Nondistended, nontender. Skin: Warm and dry, without lesions or rashes. Extremities:  No clubbing, cyanosis or edema. Neuro:   Grossly intact.  Review of systems:  Review of systems negative except as noted in HPI / PMHx or noted below: Review of Systems  Constitutional: Negative.   HENT: Negative.   Eyes: Negative.   Respiratory: Negative.   Cardiovascular: Negative.   Gastrointestinal: Negative.   Genitourinary: Negative.   Musculoskeletal: Negative.   Skin: Negative.   Neurological: Negative.   Endo/Heme/Allergies: Negative.   Psychiatric/Behavioral: Negative.     Past medical history:  Past Medical History:  Diagnosis Date  . Migraine   . Postconcussion syndrome   . Recurrent upper respiratory  infection (URI)   . TBI (traumatic brain injury) Justin [name])     Past surgical history:  Past Surgical History:  Procedure Laterality Date  . SHOULDER ACROMIOPLASTY      Family history: Family History  Problem Relation Age of Onset  . Eczema Mother   . Allergic rhinitis Mother   . Allergic rhinitis Sister   . Asthma Sister     Social history: Social History   Socioeconomic History  . Marital status: Married    Spouse name: Not on file  . Number of children: Not on file  . Years of education: Not on file  . Highest education level: Not on file  Occupational History  . Not on file  Tobacco Use  . Smoking status: Light Tobacco Smoker    Types: Cigars  . Smokeless tobacco: Never Used  Vaping Use  . Vaping Use: Never used  Substance and Sexual Activity  . Alcohol use: No  . Drug use: No  . Sexual activity: Not on file  Other Topics Concern  . Not on file  Social History Narrative  . Not on file   Social Determinants of Health   Financial Resource Strain:   . Difficulty of Paying Living Expenses:   Food Insecurity:   . Worried About Programme researcher, broadcasting/film/video in the Last Year:   . Barista in the Last Year:   Transportation Needs:   . Freight forwarder (Medical):   Marland Kitchen Lack of Transportation (Non-Medical):   Physical Activity:   . Days of Exercise per Week:   . Minutes of Exercise per Session:   Stress:   .  Feeling of Stress :   Social Connections:   . Frequency of Communication with Friends and Family:   . Frequency of Social Gatherings with Friends and Family:   . Attends Religious Services:   . Active Member of Clubs or Organizations:   . Attends Banker Meetings:   Marland Kitchen Marital Status:   Intimate Partner Violence:   . Fear of Current or Ex-Partner:   . Emotionally Abused:   Marland Kitchen Physically Abused:   . Sexually Abused:     Environmental History: The patient lives in an apartment with carpeting throughout and central air/heat.  There is no  known mold/water damage in the home.  There is a dog in the home which has access to his bedroom.  He is a non-smoker.  Current Outpatient Medications  Medication Sig Dispense Refill  . aspirin-acetaminophen-caffeine (EXCEDRIN MIGRAINE) 250-250-65 MG per tablet Take 2 tablets by mouth every 6 (six) hours as needed for headache.    . cetirizine (ZYRTEC) 10 MG tablet Take by mouth.    . fluticasone (FLONASE) 50 MCG/ACT nasal spray Place 1 spray into both nostrils in the morning and at bedtime.    Marland Kitchen ipratropium (ATROVENT) 0.03 % nasal spray Place 2 sprays into both nostrils every 12 (twelve) hours.    Marland Kitchen ketotifen (ZADITOR) 0.025 % ophthalmic solution Place 1 drop into both eyes 2 (two) times daily.    . montelukast (SINGULAIR) 10 MG tablet Take 10 mg by mouth at bedtime.    . SUMAtriptan (IMITREX) 100 MG tablet At onset of h/a and may repeat in 2 hours if needed    . EPINEPHrine (EPIPEN JR 2-PAK) 0.15 MG/0.3ML injection Use as directed for severe allergic reactions 4 each 3   No current facility-administered medications for this visit.    Known medication allergies: Allergies  Allergen Reactions  . Iodides Itching and Swelling  . Other Itching and Swelling  . Shrimp [Shellfish Allergy] Hives and Rash    I appreciate the opportunity to take part in Justin [name]'s care. Please do not hesitate to contact me with questions.  Sincerely,   R. Jorene Guest, MD

## 2020-04-27 NOTE — Assessment & Plan Note (Signed)
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 Peeling or cooking the food has shown to reduce symptoms and antihistamines may also relieve symptoms. Immunotherapy to the cross reacting pollens has improved or cured PFAS in many patients, though this has not been consistent for all patients. Typically PFAS is limited to itching or swelling of mucosal tissues from the lips to the back of the throat.   Information about PFAS has been discussed and provided in written form.  All foods causing symptoms are to be avoided.  Should symptoms progress beyond the mouth and throat, 911 is to be called immediately. 

## 2020-04-27 NOTE — Assessment & Plan Note (Addendum)
   Aeroallergen avoidance measures have been discussed and provided in written form.  Cetirizine 10 mg daily as needed.  To avoid diminishing benefit with daily use (tachyphylaxis) of second generation antihistamine, consider alternating every few months between fexofenadine (Allegra) and cetirizine (Zyrtec).  For now, continue montelukast (Singulair) 10 mg daily at bedtime.  Ipratropium 0.3% nasal spray, 2 sprays per nostril twice daily as needed.  Fluticasone nasal spray, 1-2 sprays per nostril daily as needed.  Nasal saline spray (i.e., Simply Saline) or nasal saline lavage (i.e., NeilMed) is recommended as needed and prior to medicated nasal sprays.  The risks and benefits of aeroallergen immunotherapy have been discussed. The patient is motivated to initiate immunotherapy to reduce symptoms and decrease medication requirement. Informed consent has been signed and allergen vaccine orders have been submitted. Medications will be decreased or discontinued as symptom relief from immunotherapy becomes evident.

## 2020-04-27 NOTE — Patient Instructions (Addendum)
Seasonal and perennial allergic rhinitis  Aeroallergen avoidance measures have been discussed and provided in written form.  Cetirizine 10 mg daily as needed.  To avoid diminishing benefit with daily use (tachyphylaxis) of second generation antihistamine, consider alternating every few months between fexofenadine (Allegra) and cetirizine (Zyrtec).  For now, continue montelukast (Singulair) 10 mg daily at bedtime.  Ipratropium 0.3% nasal spray, 2 sprays per nostril twice daily as needed.  Fluticasone nasal spray, 1-2 sprays per nostril daily as needed.  Nasal saline spray (i.e., Simply Saline) or nasal saline lavage (i.e., NeilMed) is recommended as needed and prior to medicated nasal sprays.  The risks and benefits of aeroallergen immunotherapy have been discussed. The patient is motivated to initiate immunotherapy to reduce symptoms and decrease medication requirement. Informed consent has been signed and allergen vaccine orders have been submitted. Medications will be decreased or discontinued as symptom relief from immunotherapy becomes evident.  Allergic conjunctivitis  Treatment plan as outlined above for allergic rhinitis.  Ketotifen (Zaditor) 1 drop per eye twice daily as needed.  I have also recommended eye lubricant drops (i.e., Natural Tears) as needed.  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 Peeling or cooking the food has shown to reduce symptoms and antihistamines may also relieve symptoms. Immunotherapy to the cross reacting pollens has improved or cured PFAS in many patients, though this has not been consistent for all patients. Typically PFAS is limited to itching or swelling of mucosal tissues from the lips to the back of the throat.   Information about PFAS has been discussed and provided in written form.  All foods causing symptoms are to be avoided.  Should symptoms  progress beyond the mouth and throat, 911 is to be called immediately.  Food allergy The patient's history suggests shellfish allergy and positive skin test results today confirm this diagnosis.  Careful avoidance of shellfish as discussed.  A prescription has been provided for epinephrine auto-injector 2 pack along with instructions for proper administration.  A food allergy action plan has been provided and discussed.  Medic Alert identification is recommended.   Return in about 3 months (around 07/28/2020), or if symptoms worsen or fail to improve.  Control of Dust Mite Allergen  House dust mites play a major role in allergic asthma and rhinitis.  They occur in environments with high humidity wherever human skin, the food for dust mites is found. High levels have been detected in dust obtained from mattresses, pillows, carpets, upholstered furniture, bed covers, clothes and soft toys.  The principal allergen of the house dust mite is found in its feces.  A gram of dust may contain 1,000 mites and 250,000 fecal particles.  Mite antigen is easily measured in the air during house cleaning activities.    1. Encase mattresses, including the box spring, and pillow, in an air tight cover.  Seal the zipper end of the encased mattresses with wide adhesive tape. 2. Wash the bedding in water of 130 degrees Farenheit weekly.  Avoid cotton comforters/quilts and flannel bedding: the most ideal bed covering is the dacron comforter. 3. Remove all upholstered furniture from the bedroom. 4. Remove carpets, carpet padding, rugs, and non-washable window drapes from the bedroom.  Wash drapes weekly or use plastic window coverings. 5. Remove all non-washable stuffed toys from the bedroom.  Wash stuffed toys weekly. 6. Have the room cleaned frequently with a vacuum cleaner and a damp dust-mop.  The patient  should not be in a room which is being cleaned and should wait 1 hour after cleaning before going into the  room. 7. Close and seal all heating outlets in the bedroom.  Otherwise, the room will become filled with dust-laden air.  An electric heater can be used to heat the room. Reduce indoor humidity to less than 50%.  Do not use a humidifier.   Reducing Pollen Exposure  The American Academy of Allergy, Asthma and Immunology suggests the following steps to reduce your exposure to pollen during allergy seasons.    1. Do not hang sheets or clothing out to dry; pollen may collect on these items. 2. Do not mow lawns or spend time around freshly cut grass; mowing stirs up pollen. 3. Keep windows closed at night.  Keep car windows closed while driving. 4. Minimize morning activities outdoors, a time when pollen counts are usually at their highest. 5. Stay indoors as much as possible when pollen counts or humidity is high and on windy days when pollen tends to remain in the air longer. 6. Use air conditioning when possible.  Many air conditioners have filters that trap the pollen spores. 7. Use a HEPA room air filter to remove pollen form the indoor air you breathe.   Control of Dog or Cat Allergen  Avoidance is the best way to manage a dog or cat allergy. If you have a dog or cat and are allergic to dog or cats, consider removing the dog or cat from the home. If you have a dog or cat but don't want to find it a new home, or if your family wants a pet even though someone in the household is allergic, here are some strategies that may help keep symptoms at bay:  1. Keep the pet out of your bedroom and restrict it to only a few rooms. Be advised that keeping the dog or cat in only one room will not limit the allergens to that room. 2. Don't pet, hug or kiss the dog or cat; if you do, wash your hands with soap and water. 3. High-efficiency particulate air (HEPA) cleaners run continuously in a bedroom or living room can reduce allergen levels over time. 4. Place electrostatic material sheet in the air inlet  vent in the bedroom. 5. Regular use of a high-efficiency vacuum cleaner or a central vacuum can reduce allergen levels. 6. Giving your dog or cat a bath at least once a week can reduce airborne allergen.   Control of Mold Allergen  Mold and fungi can grow on a variety of surfaces provided certain temperature and moisture conditions exist.  Outdoor molds grow on plants, decaying vegetation and soil.  The major outdoor mold, Alternaria and Cladosporium, are found in very high numbers during hot and dry conditions.  Generally, a late Summer - Fall peak is seen for common outdoor fungal spores.  Rain will temporarily lower outdoor mold spore count, but counts rise rapidly when the rainy period ends.  The most important indoor molds are Aspergillus and Penicillium.  Dark, humid and poorly ventilated basements are ideal sites for mold growth.  The next most common sites of mold growth are the bathroom and the kitchen.  Outdoor [employer] 1. Use air conditioning and keep windows closed 2. Avoid exposure to decaying vegetation. 3. Avoid leaf raking. 4. Avoid grain handling. 5. Consider wearing a face mask if working in moldy areas.  Indoor Mold Control 1. Maintain humidity below 50%. 2. Clean washable  surfaces with 5% bleach solution. 3. Remove sources e.g. Contaminated carpets.   Pollen-Food Allergy Syndrome (PFAS) or Oral Allergy Syndrome (OAS)  Pollen-Food Allergy Syndrome (PFAS) or Oral Allergy Syndrome (OAS) is an allergic reaction to certain (usually fresh) fruits, nuts, and vegetables. The allergy is not actually an allergy to food but a syndrome that develops in pollen allergy sufferers. The immune system mistakes the food proteins for the pollen proteins and causes an allergic reaction. For instance, an allergy to ragweed is associated with PFAS reactions to banana, watermelon, cantaloupe, honeydew, zucchini, and cucumber. This does not mean that all sufferers of an allergy to ragweed will  experience adverse effects from all or even any of these foods. Reactions may begin with one type of food and with reactions to others developing later. However, reaction to one or more foods in any given category does not necessarily mean a person is allergic to all foods in that group. PFAS sufferers may have a number of reactions that usually occur very rapidly, within minutes of eating a trigger food. The most common reaction is an itching or burning sensation in the lips, mouth, and/or pharynx. Sometimes other reactions can be triggered in the eyes, nose, and skin. The most severe reactions can result in asthma problems or anaphylaxis.  If a sufferer is able to swallow the food, there is a good chance that there will be a reaction later in the gastrointestinal tract. Vomiting, diarrhea, severe indigestion, or cramps may occur.  Treatment: An PFAS sufferer should avoid foods to which they are allergic. Peeling or cooking the food has shown to reduce symptoms in the throat and mouth, but may not relieve symptoms in the gastrointestinal tract. Antihistamines may also relieve the symptoms of the allergy. Persons with severe reactions may consider carrying injectable epinephrine should systemic symptoms occur. Allergy immunotherapy to the pollens has improved or cured PFAS in many patients, though this has not been consistent for all patients. Laban Emperor pollen: almonds, apples, celery, cherries, hazel nuts, peaches, pears, parsley, strawberry, raspberry . Birch pollen: almonds, apples, apricots, avocados, bananas, carrots, celery, cherries, chicory, coriander, fennel, fig, hazel nuts, kiwifruit, nectarines, parsley, parsnips, peaches, pears, peppers, plums, potatoes, prunes, soy, strawberries, wheat; Potential: walnuts . Grass pollen: fig, melons, tomatoes, oranges . Mugwort pollen : carrots, celery, coriander, fennel, parsley, peppers, sunflower . Ragweed pollen : banana, cantaloupe, cucumber, green pepper,  paprika, sunflower seeds/oil, honeydew, watermelon, zucchini, echinacea, artichoke, dandelions, honey (if bees pollinate from wild flowers), hibiscus or chamomile tea . Possible cross-reactions (to any of the above): berries (strawberries, blueberries, raspberries, etc), citrus (oranges, lemons, etc), grapes, mango, figs, peanut, pineapple, pomegranates, watermelon

## 2020-05-01 DIAGNOSIS — J302 Other seasonal allergic rhinitis: Secondary | ICD-10-CM | POA: Diagnosis not present

## 2020-05-01 NOTE — Progress Notes (Signed)
EXP 05/01/21 °

## 2020-05-02 DIAGNOSIS — J3089 Other allergic rhinitis: Secondary | ICD-10-CM | POA: Diagnosis not present

## 2020-05-18 ENCOUNTER — Other Ambulatory Visit: Payer: Self-pay

## 2020-05-18 ENCOUNTER — Ambulatory Visit (INDEPENDENT_AMBULATORY_CARE_PROVIDER_SITE_OTHER): Payer: No Typology Code available for payment source

## 2020-05-18 DIAGNOSIS — J309 Allergic rhinitis, unspecified: Secondary | ICD-10-CM

## 2020-05-18 MED ORDER — EPINEPHRINE 0.15 MG/0.3ML IJ SOAJ: INTRAMUSCULAR | 3 refills | Status: DC

## 2020-05-18 MED ORDER — EPINEPHRINE 0.3 MG/0.3ML IJ SOAJ: 0.3000 mg | Freq: Once | INTRAMUSCULAR | 1 refills | Status: AC

## 2020-05-18 NOTE — Addendum Note (Signed)
Addended by: [name] D on: 05/18/2020 10:29 AM   Modules accepted: Orders

## 2020-05-18 NOTE — Progress Notes (Signed)
Immunotherapy   Patient Details  Name: Justin [name] MRN: [id number] Date of Birth: [birth year]  05/18/2020  [name] started injections for  Silve 1:1 million (W-T/G-DM-D)@ 0.05 given Following schedule: A  Frequency:1 time per week Epi-Pen:Epi-Pen Available  Consent signed and patient instructions given. No problems   [name] 05/18/2020, 10:44 AM

## 2020-05-22 ENCOUNTER — Ambulatory Visit (INDEPENDENT_AMBULATORY_CARE_PROVIDER_SITE_OTHER): Payer: No Typology Code available for payment source | Admitting: *Deleted

## 2020-05-22 DIAGNOSIS — J309 Allergic rhinitis, unspecified: Secondary | ICD-10-CM

## 2020-05-30 ENCOUNTER — Ambulatory Visit (INDEPENDENT_AMBULATORY_CARE_PROVIDER_SITE_OTHER): Payer: No Typology Code available for payment source

## 2020-05-30 DIAGNOSIS — J309 Allergic rhinitis, unspecified: Secondary | ICD-10-CM | POA: Diagnosis not present

## 2020-06-05 ENCOUNTER — Ambulatory Visit (INDEPENDENT_AMBULATORY_CARE_PROVIDER_SITE_OTHER): Payer: No Typology Code available for payment source

## 2020-06-05 DIAGNOSIS — J309 Allergic rhinitis, unspecified: Secondary | ICD-10-CM | POA: Diagnosis not present

## 2020-06-12 ENCOUNTER — Ambulatory Visit (INDEPENDENT_AMBULATORY_CARE_PROVIDER_SITE_OTHER): Payer: No Typology Code available for payment source | Admitting: *Deleted

## 2020-06-12 DIAGNOSIS — J309 Allergic rhinitis, unspecified: Secondary | ICD-10-CM | POA: Diagnosis not present

## 2020-06-19 ENCOUNTER — Ambulatory Visit (INDEPENDENT_AMBULATORY_CARE_PROVIDER_SITE_OTHER): Payer: No Typology Code available for payment source | Admitting: *Deleted

## 2020-06-19 DIAGNOSIS — J309 Allergic rhinitis, unspecified: Secondary | ICD-10-CM

## 2020-06-26 ENCOUNTER — Ambulatory Visit (INDEPENDENT_AMBULATORY_CARE_PROVIDER_SITE_OTHER): Payer: No Typology Code available for payment source | Admitting: *Deleted

## 2020-06-26 DIAGNOSIS — J309 Allergic rhinitis, unspecified: Secondary | ICD-10-CM | POA: Diagnosis not present

## 2020-07-04 ENCOUNTER — Ambulatory Visit (INDEPENDENT_AMBULATORY_CARE_PROVIDER_SITE_OTHER): Payer: No Typology Code available for payment source

## 2020-07-04 DIAGNOSIS — J309 Allergic rhinitis, unspecified: Secondary | ICD-10-CM

## 2020-07-11 ENCOUNTER — Ambulatory Visit (INDEPENDENT_AMBULATORY_CARE_PROVIDER_SITE_OTHER): Payer: No Typology Code available for payment source

## 2020-07-11 DIAGNOSIS — J309 Allergic rhinitis, unspecified: Secondary | ICD-10-CM | POA: Diagnosis not present

## 2020-07-17 ENCOUNTER — Ambulatory Visit (INDEPENDENT_AMBULATORY_CARE_PROVIDER_SITE_OTHER): Payer: No Typology Code available for payment source | Admitting: *Deleted

## 2020-07-17 DIAGNOSIS — J309 Allergic rhinitis, unspecified: Secondary | ICD-10-CM

## 2020-07-24 ENCOUNTER — Telehealth: Payer: Self-pay

## 2020-07-24 ENCOUNTER — Ambulatory Visit (INDEPENDENT_AMBULATORY_CARE_PROVIDER_SITE_OTHER): Payer: No Typology Code available for payment source

## 2020-07-24 DIAGNOSIS — J309 Allergic rhinitis, unspecified: Secondary | ICD-10-CM

## 2020-07-24 NOTE — Progress Notes (Signed)
Additional labels needed. 

## 2020-07-24 NOTE — Telephone Encounter (Signed)
Justin Davenport  [name], [name], MD 2 days ago   Is there a possibility that my sleep apnea (CPAP) is caused by my Rhinitis?   I guess I ask because I was around the [place] in [place].

## 2020-07-24 NOTE — Telephone Encounter (Signed)
Severe nasal congestion can contribute to sleep apnea, however the majority of obstructive sleep apnea is caused by obstruction in the oropharynx. Thanks.

## 2020-07-26 NOTE — Telephone Encounter (Signed)
Lm for pt to call us back  

## 2020-07-31 NOTE — Telephone Encounter (Signed)
My symptoms became worse while I was in [place] I got different symptoms such as drainage sleep problems.   When I entered the [employer] I had some seasonal allergies but after getting to [place] and we started burning gasoline to get rid of human waste because we were in some undeveloped areas. We also burned rubber, vehicle and radio batteries and oil drums.    I was diagnosed with OSA in 2016 or 2017.    [place] am part of [place].    [facility] an [facility] stating is it at least as likely as not, more likely than not, likely or definitely contributing to the symptoms of sinus and rhinitis.   And an opinion is my OSA secondary to rhinitis

## 2020-07-31 NOTE — Telephone Encounter (Signed)
In 2006-2007

## 2020-08-01 ENCOUNTER — Ambulatory Visit: Payer: Self-pay | Admitting: *Deleted

## 2020-08-01 ENCOUNTER — Ambulatory Visit (INDEPENDENT_AMBULATORY_CARE_PROVIDER_SITE_OTHER): Payer: No Typology Code available for payment source | Admitting: Allergy and Immunology

## 2020-08-01 ENCOUNTER — Encounter: Payer: Self-pay | Admitting: Allergy and Immunology

## 2020-08-01 ENCOUNTER — Other Ambulatory Visit: Payer: Self-pay

## 2020-08-01 DIAGNOSIS — J3089 Other allergic rhinitis: Secondary | ICD-10-CM | POA: Diagnosis not present

## 2020-08-01 DIAGNOSIS — T7800XD Anaphylactic reaction due to unspecified food, subsequent encounter: Secondary | ICD-10-CM | POA: Diagnosis not present

## 2020-08-01 DIAGNOSIS — T7800XA Anaphylactic reaction due to unspecified food, initial encounter: Secondary | ICD-10-CM

## 2020-08-01 DIAGNOSIS — J309 Allergic rhinitis, unspecified: Secondary | ICD-10-CM

## 2020-08-01 NOTE — Assessment & Plan Note (Signed)
   Continue meticulous avoidance of shellfish and have access to epinephrine autoinjector 2 pack in case of accidental ingestion.  Food allergy action plan is in place. 

## 2020-08-01 NOTE — Patient Instructions (Addendum)
Seasonal and perennial allergic rhinoconjunctivitis Stable.  Continue appropriate allergen avoidance measures and immunotherapy injections per protocol.  For now, continue cetirizine 10 mg daily as needed.  For now, continue montelukast 10 mg daily at bedtime.  Ipratropium 0.3% nasal spray, 2 sprays per nostril 2-3 times daily as needed.  Fluticasone nasal spray, 1 to 2 sprays per nostril daily as needed.  Nasal saline spray (i.e., Simply Saline) or nasal saline lavage (i.e., NeilMed) is recommended as needed and prior to medicated nasal sprays.  Ketotifen (Zaditor) 1 drop per eye twice daily as needed.  I have also recommended eye lubricant drops (i.e., Natural Tears) as needed.  Food allergy  Continue meticulous avoidance of shellfish and have access to epinephrine autoinjector 2 pack in case of accidental ingestion.  Food allergy action plan is in place.   Return in about 1 year (around 08/01/2021), or if symptoms worsen or fail to improve.

## 2020-08-01 NOTE — Progress Notes (Signed)
Follow-up Note  RE: Justin [name] MRN: [id number] DOB: [birth year] Date of Office Visit: 08/01/2020  Primary care provider: Cleta Alberts, MD Referring provider: Cleta Alberts, MD  History of present illness: Justin [name] is a 39 y.o. male with allergic rhinoconjunctivitis, obstructive sleep apnea, and food allergy presenting today for follow-up.  He was previously seen in this clinic for his initial evaluation on April 27, 2020.  He reports that in the interval since his previous visit his nasal symptoms have improved while taking the prescribed medications.  He is receiving the immunotherapy injections without problems or complications during the buildup phase.  He reports that his rhinosinusitis symptoms progressed since being exposed to burn pits in [place] in 2006 and 2007.  He also believes that the nasal/sinus congestion is contributed to his obstructive sleep apnea which is a current problem for him.  He reports no accidental ingestion of shellfish in the interval since his previous visit.  Assessment and plan: Seasonal and perennial allergic rhinoconjunctivitis Stable.  Continue appropriate allergen avoidance measures and immunotherapy injections per protocol.  For now, continue cetirizine 10 mg daily as needed.  For now, continue montelukast 10 mg daily at bedtime.  Ipratropium 0.3% nasal spray, 2 sprays per nostril 2-3 times daily as needed.  Fluticasone nasal spray, 1 to 2 sprays per nostril daily as needed.  Nasal saline spray (i.e., Simply Saline) or nasal saline lavage (i.e., NeilMed) is recommended as needed and prior to medicated nasal sprays.  Ketotifen (Zaditor) 1 drop per eye twice daily as needed.  I have also recommended eye lubricant drops (i.e., Natural Tears) as needed.  Food allergy  Continue meticulous avoidance of shellfish and have access to epinephrine autoinjector 2 pack in case of accidental ingestion.  Food allergy action plan is in  place.   Physical examination: Blood pressure 108/70, pulse 86, temperature 98.5 F (36.9 C), temperature source Tympanic, resp. rate 16, SpO2 98 %.  General: Alert, interactive, in no acute distress. HEENT: TMs pearly gray, turbinates moderately edematous without discharge, post-pharynx unremarkable. Neck: Supple without lymphadenopathy. Lungs: Clear to auscultation without wheezing, rhonchi or rales. CV: Normal S1, S2 without murmurs. Skin: Warm and dry, without lesions or rashes.  The following portions of the patient's history were reviewed and updated as appropriate: allergies, current medications, past family history, past medical history, past social history, past surgical history and problem list.  Current Outpatient Medications  Medication Sig Dispense Refill  . cetirizine (ZYRTEC) 10 MG tablet Take by mouth.    . EPINEPHrine (EPIPEN 2-PAK) 0.3 mg/0.3 mL IJ SOAJ injection Inject 0.3 mg into the muscle as needed for anaphylaxis.    . fluticasone (FLONASE) 50 MCG/ACT nasal spray Place 1 spray into both nostrils in the morning and at bedtime.    Marland Kitchen ketotifen (ZADITOR) 0.025 % ophthalmic solution Place 1 drop into both eyes 2 (two) times daily.    . montelukast (SINGULAIR) 10 MG tablet Take 10 mg by mouth at bedtime.    . promethazine (PHENERGAN) 25 MG tablet TAKE TWO TABLETS BY MOUTH TWICE A DAY AS NEEDED FOR NAUSEA OR "SLEEP OFF"    . SUMAtriptan (IMITREX) 6 MG/0.5ML SOSY injection INJECT 6MG  (ONE SINGLE DOSE AUTOINJECTOR) SUBCUTANEOUSLY TWICE A DAY AS NEEDED FOR SEVERE "CLUSTER" HEADACHE    . topiramate (TOPAMAX) 100 MG tablet TAKE ONE TABLET BY MOUTH TWICE A DAY AND TAKE TWO TABLETS AT BEDTIME (*NOTE DOSE)    . triamterene-hydrochlorothiazide (DYAZIDE) 37.5-25 MG capsule Take 1 capsule by mouth daily.  No current facility-administered medications for this visit.    Allergies  Allergen Reactions  . Iodides Itching and Swelling  . Other Itching and Swelling  . Shrimp  [Shellfish Allergy] Hives and Rash    I appreciate the opportunity to take part in Justin [name]'s care. Please do not hesitate to contact me with questions.  Sincerely,   R. Jorene Guest, MD

## 2020-08-01 NOTE — Assessment & Plan Note (Addendum)
Stable.  Continue appropriate allergen avoidance measures and immunotherapy injections per protocol.  For now, continue cetirizine 10 mg daily as needed.  For now, continue montelukast 10 mg daily at bedtime.  Ipratropium 0.3% nasal spray, 2 sprays per nostril 2-3 times daily as needed.  Fluticasone nasal spray, 1 to 2 sprays per nostril daily as needed.  Nasal saline spray (i.e., Simply Saline) or nasal saline lavage (i.e., NeilMed) is recommended as needed and prior to medicated nasal sprays.  Ketotifen (Zaditor) 1 drop per eye twice daily as needed.  I have also recommended eye lubricant drops (i.e., Natural Tears) as needed.

## 2020-08-07 ENCOUNTER — Ambulatory Visit (INDEPENDENT_AMBULATORY_CARE_PROVIDER_SITE_OTHER): Payer: No Typology Code available for payment source

## 2020-08-07 DIAGNOSIS — J309 Allergic rhinitis, unspecified: Secondary | ICD-10-CM

## 2020-08-15 ENCOUNTER — Ambulatory Visit (INDEPENDENT_AMBULATORY_CARE_PROVIDER_SITE_OTHER): Payer: No Typology Code available for payment source | Admitting: *Deleted

## 2020-08-15 DIAGNOSIS — J309 Allergic rhinitis, unspecified: Secondary | ICD-10-CM | POA: Diagnosis not present

## 2020-08-22 ENCOUNTER — Ambulatory Visit (INDEPENDENT_AMBULATORY_CARE_PROVIDER_SITE_OTHER): Payer: No Typology Code available for payment source

## 2020-08-22 DIAGNOSIS — J309 Allergic rhinitis, unspecified: Secondary | ICD-10-CM | POA: Diagnosis not present

## 2020-08-29 DIAGNOSIS — F32A Depression, unspecified: Secondary | ICD-10-CM | POA: Insufficient documentation

## 2020-08-31 ENCOUNTER — Ambulatory Visit (INDEPENDENT_AMBULATORY_CARE_PROVIDER_SITE_OTHER): Payer: No Typology Code available for payment source

## 2020-08-31 DIAGNOSIS — J309 Allergic rhinitis, unspecified: Secondary | ICD-10-CM

## 2020-09-01 DIAGNOSIS — G44009 Cluster headache syndrome, unspecified, not intractable: Secondary | ICD-10-CM | POA: Insufficient documentation

## 2020-09-01 DIAGNOSIS — Z87820 Personal history of traumatic brain injury: Secondary | ICD-10-CM | POA: Insufficient documentation

## 2020-09-01 DIAGNOSIS — H9190 Unspecified hearing loss, unspecified ear: Secondary | ICD-10-CM | POA: Insufficient documentation

## 2020-09-05 ENCOUNTER — Ambulatory Visit: Payer: Self-pay

## 2020-09-08 ENCOUNTER — Ambulatory Visit (INDEPENDENT_AMBULATORY_CARE_PROVIDER_SITE_OTHER): Payer: No Typology Code available for payment source | Admitting: *Deleted

## 2020-09-08 DIAGNOSIS — J309 Allergic rhinitis, unspecified: Secondary | ICD-10-CM

## 2020-09-13 ENCOUNTER — Ambulatory Visit (INDEPENDENT_AMBULATORY_CARE_PROVIDER_SITE_OTHER): Payer: No Typology Code available for payment source

## 2020-09-13 DIAGNOSIS — J309 Allergic rhinitis, unspecified: Secondary | ICD-10-CM

## 2020-09-21 ENCOUNTER — Ambulatory Visit (INDEPENDENT_AMBULATORY_CARE_PROVIDER_SITE_OTHER): Payer: No Typology Code available for payment source

## 2020-09-21 DIAGNOSIS — J309 Allergic rhinitis, unspecified: Secondary | ICD-10-CM

## 2020-09-27 ENCOUNTER — Ambulatory Visit (INDEPENDENT_AMBULATORY_CARE_PROVIDER_SITE_OTHER): Payer: No Typology Code available for payment source | Admitting: *Deleted

## 2020-09-27 DIAGNOSIS — J309 Allergic rhinitis, unspecified: Secondary | ICD-10-CM

## 2020-10-03 ENCOUNTER — Ambulatory Visit (INDEPENDENT_AMBULATORY_CARE_PROVIDER_SITE_OTHER): Payer: No Typology Code available for payment source | Admitting: *Deleted

## 2020-10-03 DIAGNOSIS — J309 Allergic rhinitis, unspecified: Secondary | ICD-10-CM

## 2020-10-11 ENCOUNTER — Ambulatory Visit (INDEPENDENT_AMBULATORY_CARE_PROVIDER_SITE_OTHER): Payer: No Typology Code available for payment source

## 2020-10-11 DIAGNOSIS — J309 Allergic rhinitis, unspecified: Secondary | ICD-10-CM

## 2020-10-20 ENCOUNTER — Ambulatory Visit (INDEPENDENT_AMBULATORY_CARE_PROVIDER_SITE_OTHER): Payer: No Typology Code available for payment source | Admitting: *Deleted

## 2020-10-20 DIAGNOSIS — J309 Allergic rhinitis, unspecified: Secondary | ICD-10-CM | POA: Diagnosis not present

## 2020-10-27 ENCOUNTER — Ambulatory Visit (INDEPENDENT_AMBULATORY_CARE_PROVIDER_SITE_OTHER): Payer: No Typology Code available for payment source

## 2020-10-27 DIAGNOSIS — J309 Allergic rhinitis, unspecified: Secondary | ICD-10-CM | POA: Diagnosis not present

## 2020-11-03 ENCOUNTER — Ambulatory Visit (INDEPENDENT_AMBULATORY_CARE_PROVIDER_SITE_OTHER): Payer: No Typology Code available for payment source

## 2020-11-03 DIAGNOSIS — J309 Allergic rhinitis, unspecified: Secondary | ICD-10-CM

## 2020-11-14 ENCOUNTER — Ambulatory Visit (INDEPENDENT_AMBULATORY_CARE_PROVIDER_SITE_OTHER): Payer: No Typology Code available for payment source

## 2020-11-14 DIAGNOSIS — J309 Allergic rhinitis, unspecified: Secondary | ICD-10-CM | POA: Diagnosis not present

## 2020-11-24 ENCOUNTER — Ambulatory Visit (INDEPENDENT_AMBULATORY_CARE_PROVIDER_SITE_OTHER): Payer: No Typology Code available for payment source

## 2020-11-24 DIAGNOSIS — J309 Allergic rhinitis, unspecified: Secondary | ICD-10-CM

## 2020-12-08 ENCOUNTER — Ambulatory Visit (INDEPENDENT_AMBULATORY_CARE_PROVIDER_SITE_OTHER): Payer: No Typology Code available for payment source

## 2020-12-08 DIAGNOSIS — J309 Allergic rhinitis, unspecified: Secondary | ICD-10-CM

## 2021-04-13 ENCOUNTER — Telehealth: Payer: Self-pay | Admitting: Family

## 2021-04-13 NOTE — Telephone Encounter (Signed)
New request for service faxed to [facility], [phone].

## 2021-04-17 NOTE — Telephone Encounter (Signed)
Emailed request for service to [email or web address]

## 2021-05-01 NOTE — Telephone Encounter (Signed)
[facility] and was told there was no request on file. Was given nurses direct email to send the request. [email or web address]

## 2021-06-01 NOTE — Telephone Encounter (Signed)
Emailed request for service to [email or web address] on 05-29-2021  [facility] [phone] ext [phone]), spoke to [facility] to check on request. Patient's authorization is pending approval, Joan Mayans stated more than likely it will be approved next week and will be sent to [place] then.

## 2021-06-05 NOTE — Telephone Encounter (Signed)
Contacted [facility], spoke to [place] T. Authorization was approved today. They should be faxing it over in the next week or so. Chanel advised me to reach out to [facility] (care coordinator at [phone]) if we do not hear from the [place] in the next week or so.

## 2021-07-31 NOTE — Patient Instructions (Incomplete)
Seasonal and perennial allergic rhinoconjunctivitis. -Continue appropriate allergen avoidance measures - Continue cetirizine 10 mg daily as needed for runny nose. -Continue montelukast 10 mg daily at bedtime. - Continue Ipratropium 0.3% nasal spray, 2 sprays per nostril 2-3 times daily as needed for runny nose/drainage down throat. - Continue fluticasone nasal spray, 1 to 2 sprays per nostril daily as needed. - May use nasal saline spray (i.e., Simply Saline) or nasal saline lavage (i.e., NeilMed) is recommended as needed and prior to medicated nasal sprays. -Continue Ketotifen (Zaditor) 1 drop per eye twice daily as needed. -May use eye lubricant drops (i.e., Natural Tears) as needed.   Food allergy -Avoid shellfish. In case of an allergic reaction, give Benadryl 4 {Blank single:19197::"teaspoonful","teaspoonfuls","capsules"} every {blank single:19197::"4","6"} hours, and if life-threatening symptoms occur, inject with {Blank single:19197::"EpiPen 0.3 mg","EpiPen 0.15 mg","AuviQ 0.3 mg","AuviQ 0.15 mg","AuviQ 0.10 mg"}.   Schedule a follow up appointment in months or sooner if needed.

## 2021-08-02 ENCOUNTER — Ambulatory Visit: Payer: No Typology Code available for payment source | Admitting: Family

## 2021-11-20 ENCOUNTER — Other Ambulatory Visit: Payer: Self-pay | Admitting: Neurology

## 2021-11-20 ENCOUNTER — Other Ambulatory Visit (HOSPITAL_COMMUNITY): Payer: Self-pay | Admitting: Neurology

## 2021-11-20 DIAGNOSIS — H8109 Meniere's disease, unspecified ear: Secondary | ICD-10-CM | POA: Insufficient documentation

## 2021-11-20 DIAGNOSIS — F17200 Nicotine dependence, unspecified, uncomplicated: Secondary | ICD-10-CM | POA: Insufficient documentation

## 2021-11-20 DIAGNOSIS — N529 Male erectile dysfunction, unspecified: Secondary | ICD-10-CM | POA: Insufficient documentation

## 2021-11-20 DIAGNOSIS — G3184 Mild cognitive impairment, so stated: Secondary | ICD-10-CM

## 2021-11-20 DIAGNOSIS — L731 Pseudofolliculitis barbae: Secondary | ICD-10-CM | POA: Insufficient documentation

## 2021-11-20 DIAGNOSIS — K08531 Fractured dental restorative material with loss of material: Secondary | ICD-10-CM | POA: Insufficient documentation

## 2021-11-20 DIAGNOSIS — K0252 Dental caries on pit and fissure surface penetrating into dentin: Secondary | ICD-10-CM | POA: Insufficient documentation

## 2021-11-20 DIAGNOSIS — K056 Periodontal disease, unspecified: Secondary | ICD-10-CM | POA: Insufficient documentation

## 2021-12-04 ENCOUNTER — Ambulatory Visit (HOSPITAL_COMMUNITY)
Admission: RE | Admit: 2021-12-04 | Discharge: 2021-12-04 | Disposition: A | Discharge status: Home / Self Care | Payer: No Typology Code available for payment source | Source: Ambulatory Visit | Attending: Neurology | Admitting: Neurology

## 2021-12-04 ENCOUNTER — Other Ambulatory Visit: Payer: Self-pay

## 2021-12-04 DIAGNOSIS — G3184 Mild cognitive impairment, so stated: Secondary | ICD-10-CM | POA: Insufficient documentation

## 2021-12-04 MED ORDER — GADOBUTROL 1 MMOL/ML IV SOLN
10.0000 mL | Freq: Once | INTRAVENOUS | Status: AC | PRN
  Administered 2021-12-04: 10 mL via INTRAVENOUS

## 2022-01-13 NOTE — Progress Notes (Signed)
? ?Follow Up Note ? ?RE: Justin [name] MRN: [id number] DOB: 1981-06-28 ?Date of Office Visit: 01/14/2022 ? ?Referring provider: Torress, Conception Chancy, P* ?Primary care provider: Jeris Penta, PA-C ? ?Chief Complaint: Follow-up ? ?History of Present Illness: ?I had the pleasure of seeing Justin [name] for a follow up visit at the [facility] on 01/14/2022. He is a 41 y.o. male, who is being followed for allergic rhinoconjunctivitis and food allergy. His previous allergy office visit was on 08/01/2020 with Dr. Nunzio Cobbs. Today is a regular follow up visit. ? ?Seasonal and perennial allergic rhinoconjunctivitis ?Patient stopped allergy injections in March 2022 due to personal issues. ?Currently taking Singulair 10mg  daily, zyrtec 10mg  daily, Flonase 1 spray per nostril BID, Atrovent 2 spray per nostril BID. Sometimes has nosebleeds. ? ?Ketotifen eye drops twice a day with good benefit. ? ?No reactions in the past to the shots. ?Interested in restarting allergy injections as he doesn't want to take all these meds forever.  ?  ?Food allergy ?2021 skin testing was positive to shrimp, crab, lobster, apple, cantaloupe, watermelon. ?No reactions.  ? ?Assessment and Plan: ?Francisca is a 41 y.o. male with: ?Seasonal and perennial allergic rhinoconjunctivitis ?Past history - 2021 skin testing was positive to grass, ragweed, weeds, trees, dust mites, cat, dog.  Borderline positive to mold. On AIT for about 1 year.  ?Interim history - interested in restarting AIT as symptoms not well controlled with meds.  ?Continue environmental control measures as below. ?Continue Singulair (montelukast) 10mg  daily at night. ?Use over the counter antihistamines such as Zyrtec (cetirizine), Claritin (loratadine), Allegra (fexofenadine), or Xyzal (levocetirizine) daily as needed. May take twice a day during allergy flares. May switch antihistamines every few months. ?Use Flonase (fluticasone) nasal spray 1 spray per nostril twice a  day as needed for nasal congestion.  ?Use Atrovent (ipratropium) 0.03% 1-2 sprays per nostril twice a day as needed for runny nose/drainage. ?Use olopatadine eye drops 0.7% once a day as needed for itchy/watery eyes. Sample given. ?May use refresh eye drops as needed additionally.  ?Restart allergy injections. ?Had a detailed discussion with patient/family that clinical history is suggestive of allergic rhinitis, and may benefit from allergy immunotherapy (AIT). Discussed in detail regarding the dosing, schedule, side effects (mild to moderate local allergic reaction and rarely systemic allergic reactions including anaphylaxis), and benefits (significant improvement in nasal symptoms, seasonal flares of asthma) of immunotherapy with the patient. There is significant time commitment involved with allergy shots, which includes weekly immunotherapy injections for first 9-12 months and then biweekly to monthly injections for 3-5 years. Consent was signed. ?I have prescribed epinephrine injectable and demonstrated proper use. For mild symptoms you can take over the counter antihistamines such as Benadryl and monitor symptoms closely. If symptoms worsen or if you have severe symptoms including breathing issues, throat closure, significant swelling, whole body hives, severe diarrhea and vomiting, lightheadedness then inject epinephrine and seek immediate medical care afterwards.Emergency action plan given. ? ?Food allergy ?Past history - 2021 skin testing was positive to shrimp, crab, lobster, apple, cantaloupe, watermelon. ?Interim history - no reactions.  ?Continue to avoid shellfish, apple, cantaloupe, watermelon. ?For mild symptoms you can take over the counter antihistamines such as Benadryl and monitor symptoms closely. If symptoms worsen or if you have severe symptoms including breathing issues, throat closure, significant swelling, whole body hives, severe diarrhea and vomiting, lightheadedness then inject  epinephrine and seek immediate medical care afterwards. ?Action plan in place.  ? ?Pollen-food allergy  syndrome ?See assessment and plan as above. ? ?Return in about 6 months (around 07/16/2022). ? ?Meds ordered this encounter  ?Medications  ? Olopatadine HCl 0.7 % SOLN  ?  Sig: Apply 1 drop to eye daily as needed (itchy/watery eyes).  ?  Dispense:  7.5 mL  ?  Refill:  2  ? montelukast (SINGULAIR) 10 MG tablet  ?  Sig: Take 1 tablet (10 mg total) by mouth at bedtime.  ?  Dispense:  90 tablet  ?  Refill:  2  ? ipratropium (ATROVENT) 0.03 % nasal spray  ?  Sig: Place 1-2 sprays into both nostrils 2 (two) times daily as needed (nasal drainage).  ?  Dispense:  90 mL  ?  Refill:  2  ? EPINEPHrine 0.3 mg/0.3 mL IJ SOAJ injection  ?  Sig: Inject 0.3 mg into the muscle as needed for anaphylaxis.  ?  Dispense:  1 each  ?  Refill:  2  ?  May dispense generic/Mylan/Teva brand.  ? fluticasone (FLONASE) 50 MCG/ACT nasal spray  ?  Sig: Place 1 spray into both nostrils 2 (two) times daily as needed (nasal congestion).  ?  Dispense:  48 g  ?  Refill:  2  ? cetirizine (ZYRTEC ALLERGY) 10 MG tablet  ?  Sig: Take 1 tablet (10 mg total) by mouth daily.  ?  Dispense:  90 tablet  ?  Refill:  2  ? ?Lab Orders  ?No laboratory test(s) ordered today  ? ? ?Diagnostics: ?None.  ? ?Medication List:  ?Current Outpatient Medications  ?Medication Sig Dispense Refill  ? cetirizine (ZYRTEC ALLERGY) 10 MG tablet Take 1 tablet (10 mg total) by mouth daily. 90 tablet 2  ? EPINEPHrine 0.3 mg/0.3 mL IJ SOAJ injection Inject 0.3 mg into the muscle as needed for anaphylaxis. 1 each 2  ? fluticasone (FLONASE) 50 MCG/ACT nasal spray Place 1 spray into both nostrils 2 (two) times daily as needed (nasal congestion). 48 g 2  ? ipratropium (ATROVENT) 0.03 % nasal spray Place 1-2 sprays into both nostrils 2 (two) times daily as needed (nasal drainage). 90 mL 2  ? montelukast (SINGULAIR) 10 MG tablet Take 1 tablet (10 mg total) by mouth at bedtime. 90 tablet 2  ?  Olopatadine HCl 0.7 % SOLN Apply 1 drop to eye daily as needed (itchy/watery eyes). 7.5 mL 2  ? promethazine (PHENERGAN) 25 MG tablet TAKE TWO TABLETS BY MOUTH TWICE A DAY AS NEEDED FOR NAUSEA OR "SLEEP OFF"    ? SUMAtriptan (IMITREX) 6 MG/0.5ML SOSY injection INJECT 6MG  (ONE SINGLE DOSE AUTOINJECTOR) SUBCUTANEOUSLY TWICE A DAY AS NEEDED FOR SEVERE "CLUSTER" HEADACHE    ? topiramate (TOPAMAX) 100 MG tablet TAKE ONE TABLET BY MOUTH TWICE A DAY AND TAKE TWO TABLETS AT BEDTIME (*NOTE DOSE)    ? triamterene-hydrochlorothiazide (DYAZIDE) 37.5-25 MG capsule Take 1 capsule by mouth daily.    ? ?No current facility-administered medications for this visit.  ? ?Allergies: ?Allergies  ?Allergen Reactions  ? Iodides Itching and Swelling  ? Other Itching and Swelling  ? Shrimp [Shellfish Allergy] Hives and Rash  ? ?I reviewed his past medical history, social history, family history, and environmental history and no significant changes have been reported from his previous visit. ? ?Review of Systems  ?Constitutional:  Negative for appetite change, chills, fever and unexpected weight change.  ?HENT:  Positive for congestion and rhinorrhea.   ?Eyes:  Positive for itching.  ?Respiratory:  Negative for cough, chest tightness, shortness of  breath and wheezing.   ?Gastrointestinal:  Negative for abdominal pain.  ?Skin:  Negative for rash.  ?Allergic/Immunologic: Positive for environmental allergies and food allergies.  ?Neurological:  Negative for headaches.  ? ?Objective: ?BP 126/76   Pulse 79   Temp 98.1 ?F (36.7 ?C)   Resp 16   Ht 5\' 7"  (1.702 m)   Wt 254 lb (115.2 kg)   SpO2 96%   BMI 39.78 kg/m?  ?Body mass index is 39.78 kg/m? ?Physical Exam ?Vitals and nursing note reviewed.  ?Constitutional:   ?   Appearance: Normal appearance. He is well-developed.  ?HENT:  ?   Head: Normocephalic and atraumatic.  ?   Right Ear: Tympanic membrane and external ear normal.  ?   Left Ear: Tympanic membrane and external ear normal.  ?    Nose: Nose normal.  ?   Mouth/Throat:  ?   Mouth: Mucous membranes are moist.  ?   Pharynx: Oropharynx is clear.  ?Eyes:  ?   Conjunctiva/sclera: Conjunctivae normal.  ?Cardiovascular:  ?   Rate and Rhythm: Normal ra

## 2022-01-14 ENCOUNTER — Ambulatory Visit (INDEPENDENT_AMBULATORY_CARE_PROVIDER_SITE_OTHER): Payer: No Typology Code available for payment source | Admitting: Allergy

## 2022-01-14 ENCOUNTER — Encounter: Payer: Self-pay | Admitting: Allergy

## 2022-01-14 VITALS — BP 126/76 | HR 79 | Temp 98.1°F | Resp 16 | Ht 67.0 in | Wt 254.0 lb

## 2022-01-14 DIAGNOSIS — H1013 Acute atopic conjunctivitis, bilateral: Secondary | ICD-10-CM

## 2022-01-14 DIAGNOSIS — T7819XD Other adverse food reactions, not elsewhere classified, subsequent encounter: Secondary | ICD-10-CM

## 2022-01-14 DIAGNOSIS — T781XXD Other adverse food reactions, not elsewhere classified, subsequent encounter: Secondary | ICD-10-CM

## 2022-01-14 DIAGNOSIS — J302 Other seasonal allergic rhinitis: Secondary | ICD-10-CM | POA: Diagnosis not present

## 2022-01-14 DIAGNOSIS — T7800XA Anaphylactic reaction due to unspecified food, initial encounter: Secondary | ICD-10-CM

## 2022-01-14 DIAGNOSIS — J3089 Other allergic rhinitis: Secondary | ICD-10-CM

## 2022-01-14 MED ORDER — IPRATROPIUM BROMIDE 0.03 % NA SOLN: 1.0000 | Freq: Two times a day (BID) | NASAL | 2 refills | Status: DC | PRN

## 2022-01-14 MED ORDER — FLUTICASONE PROPIONATE 50 MCG/ACT NA SUSP: 1.0000 | Freq: Two times a day (BID) | NASAL | 2 refills | Status: DC | PRN

## 2022-01-14 MED ORDER — MONTELUKAST SODIUM 10 MG PO TABS: 10.0000 mg | ORAL_TABLET | Freq: Every day | ORAL | 2 refills | Status: DC

## 2022-01-14 MED ORDER — EPINEPHRINE 0.3 MG/0.3ML IJ SOAJ: 0.3000 mg | INTRAMUSCULAR | 2 refills | Status: DC | PRN

## 2022-01-14 MED ORDER — CETIRIZINE HCL 10 MG PO TABS: 10.0000 mg | ORAL_TABLET | Freq: Every day | ORAL | 2 refills | Status: DC

## 2022-01-14 MED ORDER — OLOPATADINE HCL 0.7 % OP SOLN: 1.0000 [drp] | Freq: Every day | OPHTHALMIC | 2 refills | Status: DC | PRN

## 2022-01-14 NOTE — Assessment & Plan Note (Signed)
Past history - 2021 skin testing was positive to grass, ragweed, weeds, trees, dust mites, cat, dog.  Borderline positive to mold. On AIT for about 1 year.  ?Interim history - interested in restarting AIT as symptoms not well controlled with meds.  ?? Continue environmental control measures as below. ?? Continue Singulair (montelukast) 10mg  daily at night. ?? Use over the counter antihistamines such as Zyrtec (cetirizine), Claritin (loratadine), Allegra (fexofenadine), or Xyzal (levocetirizine) daily as needed. May take twice a day during allergy flares. May switch antihistamines every few months. ?? Use Flonase (fluticasone) nasal spray 1 spray per nostril twice a day as needed for nasal congestion.  ?? Use Atrovent (ipratropium) 0.03% 1-2 sprays per nostril twice a day as needed for runny nose/drainage. ?? Use olopatadine eye drops 0.7% once a day as needed for itchy/watery eyes. Sample given. ?? May use refresh eye drops as needed additionally.  ?? Restart allergy injections. ?? Had a detailed discussion with patient/family that clinical history is suggestive of allergic rhinitis, and may benefit from allergy immunotherapy (AIT). Discussed in detail regarding the dosing, schedule, side effects (mild to moderate local allergic reaction and rarely systemic allergic reactions including anaphylaxis), and benefits (significant improvement in nasal symptoms, seasonal flares of asthma) of immunotherapy with the patient. There is significant time commitment involved with allergy shots, which includes weekly immunotherapy injections for first 9-12 months and then biweekly to monthly injections for 3-5 years. Consent was signed. ?? I have prescribed epinephrine injectable and demonstrated proper use. For mild symptoms you can take over the counter antihistamines such as Benadryl and monitor symptoms closely. If symptoms worsen or if you have severe symptoms including breathing issues, throat closure, significant swelling,  whole body hives, severe diarrhea and vomiting, lightheadedness then inject epinephrine and seek immediate medical care afterwards.Emergency action plan given. ?

## 2022-01-14 NOTE — Patient Instructions (Addendum)
Allergic rhinoconjunctivitis ?2021 skin testing was positive to grass, ragweed, weeds, trees, dust mites, cat, dog.  Borderline positive to mold.  ?Continue environmental control measures as below. ?Continue Singulair (montelukast) 10mg  daily at night. ?Use over the counter antihistamines such as Zyrtec (cetirizine), Claritin (loratadine), Allegra (fexofenadine), or Xyzal (levocetirizine) daily as needed. May take twice a day during allergy flares. May switch antihistamines every few months. ?Use Flonase (fluticasone) nasal spray 1 spray per nostril twice a day as needed for nasal congestion.  ?Use Atrovent (ipratropium) 0.03% 1-2 sprays per nostril twice a day as needed for runny nose/drainage. ?Use olopatadine eye drops 0.7% once a day as needed for itchy/watery eyes. Sample given. ?May use refresh eye drops as needed additionally.  ?Restart allergy injections.  ? ?Food allergy ?2021 skin testing was positive to shrimp, crab, lobster, apple, cantaloupe, watermelon. ?For mild symptoms you can take over the counter antihistamines such as Benadryl and monitor symptoms closely. If symptoms worsen or if you have severe symptoms including breathing issues, throat closure, significant swelling, whole body hives, severe diarrhea and vomiting, lightheadedness then inject epinephrine and seek immediate medical care afterwards. ?Action plan in place.  ? ?Follow up in 6 months or sooner if needed.  ? ?

## 2022-01-14 NOTE — Assessment & Plan Note (Signed)
Past history - 2021 skin testing was positive to shrimp, crab, lobster, apple, cantaloupe, watermelon. Interim history - no reactions.  . Continue to avoid shellfish, apple, cantaloupe, watermelon. . For mild symptoms you can take over the counter antihistamines such as Benadryl and monitor symptoms closely. If symptoms worsen or if you have severe symptoms including breathing issues, throat closure, significant swelling, whole body hives, severe diarrhea and vomiting, lightheadedness then inject epinephrine and seek immediate medical care afterwards. . Action plan in place.  

## 2022-01-14 NOTE — Assessment & Plan Note (Signed)
.   See assessment and plan as above. 

## 2022-01-15 DIAGNOSIS — J301 Allergic rhinitis due to pollen: Secondary | ICD-10-CM

## 2022-01-15 NOTE — Progress Notes (Signed)
Aeroallergen Immunotherapy  ? ?Ordering Provider: Dr. Rexene Alberts  ? ?Patient Details  ?Name: Justin [name]  ?MRN: [id number]  ?Date of Birth: [birth year]  ? ?Order 1 of 2  ? ?Vial Label: RW-W-T  ? ?0.3 ml (Volume)  1:20 Concentration -- Ragweed Mix  ?0.5 ml (Volume)  1:20 Concentration -- Weed Mix*  ?0.5 ml (Volume)  1:20 Concentration -- Eastern 10 Tree Mix (also Sweet Gum)  ?0.2 ml (Volume)  1:20 Concentration -- Box Elder  ?0.2 ml (Volume)  1:10 Concentration -- Cedar, red  ?0.2 ml (Volume)  1:10 Concentration -- Pecan Pollen  ?0.2 ml (Volume)  1:10 Concentration -- Pine Mix  ?0.2 ml (Volume)  1:20 Concentration -- Walnut, Black Pollen  ? ? ?2.3  ml Extract Subtotal  ?2.7  ml Diluent  ?5.0  ml Maintenance Total  ? ?Schedule:  B  ?Blue Vial (1:100,000): Schedule B (6 doses)  ?Yellow Vial (1:10,000): Schedule B (6 doses)  ?Green Vial (1:1,000): Schedule B (6 doses)  ?Red Vial (1:100): Schedule A (10 doses)  ? ?Special Instructions: once per week ?

## 2022-01-15 NOTE — Progress Notes (Signed)
Aeroallergen Immunotherapy  ? ?Ordering Provider: Dr. Wyline Mood  ? ?Patient Details  ?Name: Antwann Preziosi  ?MRN: [id number]  ?Date of Birth: 09-10-1981  ? ?Order 2 of 2  ? ?Vial Label: G-Dm-C-D  ? ?0.3 ml (Volume)  BAU Concentration -- 7 Grass Mix* 100,000 ([address] [place], [place], [place], [place], [place], Sweet Vernal, [place]cial [place])  ?0.2 ml (Volume)  1:20 Concentration -- [place]  ?0.3 ml (Volume)  BAU Concentration -- [place] 10,000  ?0.2 ml (Volume)  1:20 Concentration -- [place]  ?0.5 ml (Volume)  1:10 Concentration -- Cat Hair  ?0.5 ml (Volume)  1:10 Concentration -- Dog Epithelia  ?0.5 ml (Volume)   AU Concentration -- Mite Mix (DF 5,000 & DP 5,000)  ? ? ?2.5  ml Extract Subtotal  ?2.5  ml Diluent  ?5.0  ml Maintenance Total  ? ?Schedule:  B  ?Blue Vial (1:100,000): Schedule B (6 doses)  ?Yellow Vial (1:10,000): Schedule B (6 doses)  ?Green Vial (1:1,000): Schedule B (6 doses)  ?Red Vial (1:100): Schedule A (14 doses)  ? ?Special Instructions: once per week ?

## 2022-01-15 NOTE — Progress Notes (Signed)
VIALS EXP 01-16-23 ?

## 2022-01-16 DIAGNOSIS — J3089 Other allergic rhinitis: Secondary | ICD-10-CM

## 2022-01-29 ENCOUNTER — Other Ambulatory Visit: Payer: Self-pay

## 2022-01-29 MED ORDER — OLOPATADINE HCL 0.1 % OP SOLN: OPHTHALMIC | 5 refills | Status: DC

## 2022-01-29 NOTE — Telephone Encounter (Signed)
Sent in olopatadine 0.1% that is what the [place] prefers.  ?

## 2022-02-05 ENCOUNTER — Ambulatory Visit (INDEPENDENT_AMBULATORY_CARE_PROVIDER_SITE_OTHER): Payer: No Typology Code available for payment source

## 2022-02-05 DIAGNOSIS — J309 Allergic rhinitis, unspecified: Secondary | ICD-10-CM | POA: Diagnosis not present

## 2022-02-05 NOTE — Progress Notes (Signed)
Immunotherapy ? ? ?Patient Details  ?Name: Justin [name] ?MRN: [id number] ?Date of Birth: September 06, 1981 ? ?02/05/2022 ? ?[name] started injections for  G-DM-C-D and RW-W-T. Patient received 0.05 of both his blue vials with an expiration of 01/16/2023. Patient waited 30 minutes with no problems. ?Following schedule: B  ?Frequency:1 time per week ?Epi-Pen:Epi-Pen Available  ?Consent signed and patient instructions given. ? ? ?Dub Mikes ?02/05/2022, 10:19 AM ? ? ?

## 2022-02-19 ENCOUNTER — Ambulatory Visit (INDEPENDENT_AMBULATORY_CARE_PROVIDER_SITE_OTHER): Payer: No Typology Code available for payment source

## 2022-02-19 DIAGNOSIS — J309 Allergic rhinitis, unspecified: Secondary | ICD-10-CM

## 2022-02-27 ENCOUNTER — Ambulatory Visit (INDEPENDENT_AMBULATORY_CARE_PROVIDER_SITE_OTHER): Payer: No Typology Code available for payment source

## 2022-02-27 DIAGNOSIS — J309 Allergic rhinitis, unspecified: Secondary | ICD-10-CM | POA: Diagnosis not present

## 2022-03-07 ENCOUNTER — Ambulatory Visit (INDEPENDENT_AMBULATORY_CARE_PROVIDER_SITE_OTHER): Payer: No Typology Code available for payment source

## 2022-03-07 DIAGNOSIS — J309 Allergic rhinitis, unspecified: Secondary | ICD-10-CM

## 2022-03-15 ENCOUNTER — Ambulatory Visit (INDEPENDENT_AMBULATORY_CARE_PROVIDER_SITE_OTHER): Payer: No Typology Code available for payment source | Admitting: *Deleted

## 2022-03-15 DIAGNOSIS — J309 Allergic rhinitis, unspecified: Secondary | ICD-10-CM

## 2022-03-18 ENCOUNTER — Ambulatory Visit (INDEPENDENT_AMBULATORY_CARE_PROVIDER_SITE_OTHER): Payer: No Typology Code available for payment source

## 2022-03-18 DIAGNOSIS — J309 Allergic rhinitis, unspecified: Secondary | ICD-10-CM | POA: Diagnosis not present

## 2022-03-19 ENCOUNTER — Other Ambulatory Visit: Payer: Self-pay

## 2022-03-19 MED ORDER — OLOPATADINE HCL 0.1 % OP SOLN: 1.0000 [drp] | Freq: Two times a day (BID) | OPHTHALMIC | 5 refills | Status: AC

## 2022-03-28 ENCOUNTER — Ambulatory Visit (INDEPENDENT_AMBULATORY_CARE_PROVIDER_SITE_OTHER): Payer: No Typology Code available for payment source

## 2022-03-28 DIAGNOSIS — J309 Allergic rhinitis, unspecified: Secondary | ICD-10-CM | POA: Diagnosis not present

## 2022-04-01 ENCOUNTER — Ambulatory Visit (INDEPENDENT_AMBULATORY_CARE_PROVIDER_SITE_OTHER): Payer: No Typology Code available for payment source | Admitting: *Deleted

## 2022-04-01 DIAGNOSIS — J309 Allergic rhinitis, unspecified: Secondary | ICD-10-CM

## 2022-04-08 ENCOUNTER — Ambulatory Visit (INDEPENDENT_AMBULATORY_CARE_PROVIDER_SITE_OTHER): Payer: No Typology Code available for payment source

## 2022-04-08 DIAGNOSIS — J309 Allergic rhinitis, unspecified: Secondary | ICD-10-CM

## 2022-04-15 ENCOUNTER — Ambulatory Visit (INDEPENDENT_AMBULATORY_CARE_PROVIDER_SITE_OTHER): Payer: No Typology Code available for payment source

## 2022-04-15 DIAGNOSIS — J309 Allergic rhinitis, unspecified: Secondary | ICD-10-CM

## 2022-04-23 ENCOUNTER — Ambulatory Visit (INDEPENDENT_AMBULATORY_CARE_PROVIDER_SITE_OTHER): Payer: No Typology Code available for payment source

## 2022-04-23 DIAGNOSIS — J309 Allergic rhinitis, unspecified: Secondary | ICD-10-CM | POA: Diagnosis not present

## 2022-04-30 ENCOUNTER — Ambulatory Visit (INDEPENDENT_AMBULATORY_CARE_PROVIDER_SITE_OTHER): Payer: No Typology Code available for payment source | Admitting: *Deleted

## 2022-04-30 DIAGNOSIS — J309 Allergic rhinitis, unspecified: Secondary | ICD-10-CM

## 2022-05-06 ENCOUNTER — Ambulatory Visit (INDEPENDENT_AMBULATORY_CARE_PROVIDER_SITE_OTHER): Payer: No Typology Code available for payment source | Admitting: *Deleted

## 2022-05-06 DIAGNOSIS — J309 Allergic rhinitis, unspecified: Secondary | ICD-10-CM | POA: Diagnosis not present

## 2022-05-13 ENCOUNTER — Ambulatory Visit (INDEPENDENT_AMBULATORY_CARE_PROVIDER_SITE_OTHER): Payer: No Typology Code available for payment source | Admitting: *Deleted

## 2022-05-13 DIAGNOSIS — J309 Allergic rhinitis, unspecified: Secondary | ICD-10-CM

## 2022-05-24 ENCOUNTER — Ambulatory Visit (INDEPENDENT_AMBULATORY_CARE_PROVIDER_SITE_OTHER): Payer: No Typology Code available for payment source | Admitting: *Deleted

## 2022-05-24 DIAGNOSIS — J309 Allergic rhinitis, unspecified: Secondary | ICD-10-CM | POA: Diagnosis not present

## 2022-05-29 ENCOUNTER — Ambulatory Visit (INDEPENDENT_AMBULATORY_CARE_PROVIDER_SITE_OTHER): Payer: No Typology Code available for payment source | Admitting: *Deleted

## 2022-05-29 DIAGNOSIS — J309 Allergic rhinitis, unspecified: Secondary | ICD-10-CM | POA: Diagnosis not present

## 2022-06-11 ENCOUNTER — Ambulatory Visit (INDEPENDENT_AMBULATORY_CARE_PROVIDER_SITE_OTHER): Payer: No Typology Code available for payment source

## 2022-06-11 DIAGNOSIS — J309 Allergic rhinitis, unspecified: Secondary | ICD-10-CM

## 2022-06-19 ENCOUNTER — Ambulatory Visit (INDEPENDENT_AMBULATORY_CARE_PROVIDER_SITE_OTHER): Payer: No Typology Code available for payment source | Admitting: *Deleted

## 2022-06-19 DIAGNOSIS — J309 Allergic rhinitis, unspecified: Secondary | ICD-10-CM

## 2022-06-26 ENCOUNTER — Ambulatory Visit (INDEPENDENT_AMBULATORY_CARE_PROVIDER_SITE_OTHER): Payer: No Typology Code available for payment source

## 2022-06-26 DIAGNOSIS — J309 Allergic rhinitis, unspecified: Secondary | ICD-10-CM

## 2022-07-01 ENCOUNTER — Ambulatory Visit (INDEPENDENT_AMBULATORY_CARE_PROVIDER_SITE_OTHER): Payer: No Typology Code available for payment source

## 2022-07-01 DIAGNOSIS — J309 Allergic rhinitis, unspecified: Secondary | ICD-10-CM | POA: Diagnosis not present

## 2022-07-09 ENCOUNTER — Ambulatory Visit (INDEPENDENT_AMBULATORY_CARE_PROVIDER_SITE_OTHER): Payer: No Typology Code available for payment source

## 2022-07-09 DIAGNOSIS — J309 Allergic rhinitis, unspecified: Secondary | ICD-10-CM

## 2022-07-14 NOTE — Progress Notes (Unsigned)
Follow Up Note  RE: Justin [name] MRN: [id number] DOB: 1981/02/09 Date of Office Visit: 07/15/2022  Referring provider: Doreene Adas* Primary care provider: Harrold Donath, PA-C  Chief Complaint: No chief complaint on file.  History of Present Illness: I had the pleasure of seeing Alton Bouknight for a follow up visit at the [facility] on 07/14/2022. He is a 41 y.o. male, who is being followed for allergic rhinoconjunctivitis on AIT and food allergy. His previous allergy office visit was on 01/14/2022 with Dr. Maudie Mercury. Today is a regular follow up visit.  Seasonal and perennial allergic rhinoconjunctivitis Past history - 2021 skin testing was positive to grass, ragweed, weeds, trees, dust mites, cat, dog.  Borderline positive to mold. On AIT for about 1 year.  Interim history - interested in restarting AIT as symptoms not well controlled with meds.  Continue environmental control measures as below. Continue Singulair (montelukast) 10mg  daily at night. Use over the counter antihistamines such as Zyrtec (cetirizine), Claritin (loratadine), Allegra (fexofenadine), or Xyzal (levocetirizine) daily as needed. May take twice a day during allergy flares. May switch antihistamines every few months. Use Flonase (fluticasone) nasal spray 1 spray per nostril twice a day as needed for nasal congestion.  Use Atrovent (ipratropium) 0.03% 1-2 sprays per nostril twice a day as needed for runny nose/drainage. Use olopatadine eye drops 0.7% once a day as needed for itchy/watery eyes. Sample given. May use refresh eye drops as needed additionally.  Restart allergy injections. Had a detailed discussion with patient/family that clinical history is suggestive of allergic rhinitis, and may benefit from allergy immunotherapy (AIT). Discussed in detail regarding the dosing, schedule, side effects (mild to moderate local allergic reaction and rarely systemic allergic reactions including  anaphylaxis), and benefits (significant improvement in nasal symptoms, seasonal flares of asthma) of immunotherapy with the patient. There is significant time commitment involved with allergy shots, which includes weekly immunotherapy injections for first 9-12 months and then biweekly to monthly injections for 3-5 years. Consent was signed. I have prescribed epinephrine injectable and demonstrated proper use. For mild symptoms you can take over the counter antihistamines such as Benadryl and monitor symptoms closely. If symptoms worsen or if you have severe symptoms including breathing issues, throat closure, significant swelling, whole body hives, severe diarrhea and vomiting, lightheadedness then inject epinephrine and seek immediate medical care afterwards.Emergency action plan given.   Food allergy Past history - 2021 skin testing was positive to shrimp, crab, lobster, apple, cantaloupe, watermelon. Interim history - no reactions.  Continue to avoid shellfish, apple, cantaloupe, watermelon. For mild symptoms you can take over the counter antihistamines such as Benadryl and monitor symptoms closely. If symptoms worsen or if you have severe symptoms including breathing issues, throat closure, significant swelling, whole body hives, severe diarrhea and vomiting, lightheadedness then inject epinephrine and seek immediate medical care afterwards. Action plan in place.    Pollen-food allergy syndrome See assessment and plan as above.  Assessment and Plan: Jermell is a 41 y.o. male with: No problem-specific Assessment & Plan notes found for this encounter.  No follow-ups on file.  No orders of the defined types were placed in this encounter.  Lab Orders  No laboratory test(s) ordered today    Diagnostics: Spirometry:  Tracings reviewed. His effort: {Blank single:19197::"Good reproducible efforts.","It was hard to get consistent efforts and there is a question as to whether this reflects a  maximal maneuver.","Poor effort, data can not be interpreted."} FVC: ***L FEV1: ***  L, ***% predicted FEV1/FVC ratio: ***% Interpretation: {Blank single:19197::"Spirometry consistent with mild obstructive disease","Spirometry consistent with moderate obstructive disease","Spirometry consistent with severe obstructive disease","Spirometry consistent with possible restrictive disease","Spirometry consistent with mixed obstructive and restrictive disease","Spirometry uninterpretable due to technique","Spirometry consistent with normal pattern","No overt abnormalities noted given today's efforts"}.  Please see scanned spirometry results for details.  Skin Testing: {Blank single:19197::"Select foods","Environmental allergy panel","Environmental allergy panel and select foods","Food allergy panel","None","Deferred due to recent antihistamines use"}. *** Results discussed with patient/family.   Medication List:  Current Outpatient Medications  Medication Sig Dispense Refill  . cetirizine (ZYRTEC ALLERGY) 10 MG tablet Take 1 tablet (10 mg total) by mouth daily. 90 tablet 2  . EPINEPHrine 0.3 mg/0.3 mL IJ SOAJ injection Inject 0.3 mg into the muscle as needed for anaphylaxis. 1 each 2  . fluticasone (FLONASE) 50 MCG/ACT nasal spray Place 1 spray into both nostrils 2 (two) times daily as needed (nasal congestion). 48 g 2  . ipratropium (ATROVENT) 0.03 % nasal spray Place 1-2 sprays into both nostrils 2 (two) times daily as needed (nasal drainage). 90 mL 2  . montelukast (SINGULAIR) 10 MG tablet Take 1 tablet (10 mg total) by mouth at bedtime. 90 tablet 2  . olopatadine (PATANOL) 0.1 % ophthalmic solution Instill one drop into each eye twice daily for itchy eyes 5 mL 5  . olopatadine (PATANOL) 0.1 % ophthalmic solution Place 1 drop into both eyes 2 (two) times daily. 5 mL 5  . promethazine (PHENERGAN) 25 MG tablet TAKE TWO TABLETS BY MOUTH TWICE A DAY AS NEEDED FOR NAUSEA OR "SLEEP OFF"    . SUMAtriptan  (IMITREX) 6 MG/0.5ML SOSY injection INJECT 6MG  (ONE SINGLE DOSE AUTOINJECTOR) SUBCUTANEOUSLY TWICE A DAY AS NEEDED FOR SEVERE "CLUSTER" HEADACHE    . topiramate (TOPAMAX) 100 MG tablet TAKE ONE TABLET BY MOUTH TWICE A DAY AND TAKE TWO TABLETS AT BEDTIME (*NOTE DOSE)    . triamterene-hydrochlorothiazide (DYAZIDE) 37.5-25 MG capsule Take 1 capsule by mouth daily.     No current facility-administered medications for this visit.   Allergies: Allergies  Allergen Reactions  . Iodides Itching and Swelling  . Other Itching and Swelling  . Shrimp [Shellfish Allergy] Hives and Rash   I reviewed his past medical history, social history, family history, and environmental history and no significant changes have been reported from his previous visit.  Review of Systems  Constitutional:  Negative for appetite change, chills, fever and unexpected weight change.  HENT:  Positive for congestion and rhinorrhea.   Eyes:  Positive for itching.  Respiratory:  Negative for cough, chest tightness, shortness of breath and wheezing.   Gastrointestinal:  Negative for abdominal pain.  Skin:  Negative for rash.  Allergic/Immunologic: Positive for environmental allergies and food allergies.  Neurological:  Negative for headaches.   Objective: There were no vitals taken for this visit. There is no height or weight on file to calculate BMI. Physical Exam Vitals and nursing note reviewed.  Constitutional:      Appearance: Normal appearance. He is well-developed.  HENT:     Head: Normocephalic and atraumatic.     Right Ear: Tympanic membrane and external ear normal.     Left Ear: Tympanic membrane and external ear normal.     Nose: Nose normal.     Mouth/Throat:     Mouth: Mucous membranes are moist.     Pharynx: Oropharynx is clear.  Eyes:     Conjunctiva/sclera: Conjunctivae normal.  Cardiovascular:     Rate and  Rhythm: Normal rate and regular rhythm.     Heart sounds: Normal heart sounds. No murmur  heard. Pulmonary:     Effort: Pulmonary effort is normal.     Breath sounds: Normal breath sounds. No wheezing, rhonchi or rales.  Musculoskeletal:     Cervical back: Neck supple.  Skin:    General: Skin is warm.     Findings: No rash.  Neurological:     Mental Status: He is alert and oriented to person, place, and time.  Psychiatric:        Behavior: Behavior normal.  Previous notes and tests were reviewed. The plan was reviewed with the patient/family, and all questions/concerned were addressed.  It was my pleasure to see Ikey today and participate in his care. Please feel free to contact me with any questions or concerns.  Sincerely,  Wyline Mood, DO Allergy & Immunology  [facility] office: [phone] [facility] office: [phone]

## 2022-07-15 ENCOUNTER — Other Ambulatory Visit: Payer: Self-pay

## 2022-07-15 ENCOUNTER — Ambulatory Visit: Payer: Self-pay

## 2022-07-15 ENCOUNTER — Ambulatory Visit (INDEPENDENT_AMBULATORY_CARE_PROVIDER_SITE_OTHER): Payer: No Typology Code available for payment source | Admitting: Allergy

## 2022-07-15 ENCOUNTER — Encounter: Payer: Self-pay | Admitting: Allergy

## 2022-07-15 VITALS — BP 138/92 | HR 84 | Temp 98.3°F | Resp 16 | Ht 66.54 in | Wt 249.4 lb

## 2022-07-15 DIAGNOSIS — R030 Elevated blood-pressure reading, without diagnosis of hypertension: Secondary | ICD-10-CM | POA: Insufficient documentation

## 2022-07-15 DIAGNOSIS — H1010 Acute atopic conjunctivitis, unspecified eye: Secondary | ICD-10-CM

## 2022-07-15 DIAGNOSIS — H1013 Acute atopic conjunctivitis, bilateral: Secondary | ICD-10-CM

## 2022-07-15 DIAGNOSIS — T7800XA Anaphylactic reaction due to unspecified food, initial encounter: Secondary | ICD-10-CM

## 2022-07-15 DIAGNOSIS — J309 Allergic rhinitis, unspecified: Secondary | ICD-10-CM

## 2022-07-15 DIAGNOSIS — T781XXD Other adverse food reactions, not elsewhere classified, subsequent encounter: Secondary | ICD-10-CM

## 2022-07-15 MED ORDER — OLOPATADINE HCL 0.1 % OP SOLN: OPHTHALMIC | 1 refills | Status: DC

## 2022-07-15 MED ORDER — IPRATROPIUM BROMIDE 0.03 % NA SOLN: 1.0000 | Freq: Two times a day (BID) | NASAL | 2 refills | Status: AC | PRN

## 2022-07-15 MED ORDER — EPINEPHRINE 0.3 MG/0.3ML IJ SOAJ: 0.3000 mg | INTRAMUSCULAR | 2 refills | Status: DC | PRN

## 2022-07-15 MED ORDER — CETIRIZINE HCL 10 MG PO TABS: 10.0000 mg | ORAL_TABLET | Freq: Every day | ORAL | 2 refills | Status: DC

## 2022-07-15 MED ORDER — MONTELUKAST SODIUM 10 MG PO TABS: 10.0000 mg | ORAL_TABLET | Freq: Every day | ORAL | 2 refills | Status: DC

## 2022-07-15 MED ORDER — FLUTICASONE PROPIONATE 50 MCG/ACT NA SUSP: 1.0000 | Freq: Two times a day (BID) | NASAL | 2 refills | Status: AC | PRN

## 2022-07-15 NOTE — Assessment & Plan Note (Signed)
Elevated reading at 142/90 and repeat at 138/92.  Follow up with PCP regarding this.

## 2022-07-15 NOTE — Assessment & Plan Note (Signed)
Past history - 2021 skin testing was positive to grass, ragweed, weeds, trees, dust mites, cat, dog.  Borderline positive to mold. On AIT for about 1 year.  Interim history - restarted AIT on 02/05/2022 (G-DM-C-D and RW-W-T) with some localized reactions.  . Continue environmental control measures.  . Continue Singulair (montelukast) 10mg  daily at night. . Use over the counter antihistamines such as Zyrtec (cetirizine), Claritin (loratadine), Allegra (fexofenadine), or Xyzal (levocetirizine) daily as needed. May take twice a day during allergy flares. May switch antihistamines every few months. . Use Flonase (fluticasone) nasal spray 1 spray per nostril twice a day as needed for nasal congestion.  . Use Atrovent (ipratropium) 0.03% 1-2 sprays per nostril twice a day as needed for runny nose/drainage. .Use olopatadine eye drops 0.1% twice a day as needed for itchy/watery eyes. . May use refresh eye drops as needed additionally.  . Continue allergy injections - given today. o You may take an extra allergy medication on the days of your injections to help with the localized itching/rash.

## 2022-07-15 NOTE — Assessment & Plan Note (Signed)
.   See assessment and plan as above. 

## 2022-07-15 NOTE — Patient Instructions (Addendum)
Allergic rhinoconjunctivitis 2021 skin testing was positive to grass, ragweed, weeds, trees, dust mites, cat, dog.  Borderline positive to mold.  Continue environmental control measures.  Continue Singulair (montelukast) 10mg  daily at night. Use over the counter antihistamines such as Zyrtec (cetirizine), Claritin (loratadine), Allegra (fexofenadine), or Xyzal (levocetirizine) daily as needed. May take twice a day during allergy flares. May switch antihistamines every few months. Use Flonase (fluticasone) nasal spray 1 spray per nostril twice a day as needed for nasal congestion.  Use Atrovent (ipratropium) 0.03% 1-2 sprays per nostril twice a day as needed for runny nose/drainage. Use olopatadine eye drops 0.1% twice a day as needed for itchy/watery eyes.  May use refresh eye drops as needed additionally.  Continue allergy injections - given today. You may take an extra allergy medication on the days of your injections to help with the localized itching/rash.  Food allergy 2021 skin testing was positive to shrimp, crab, lobster, apple, cantaloupe, watermelon. For mild symptoms you can take over the counter antihistamines such as Benadryl and monitor symptoms closely. If symptoms worsen or if you have severe symptoms including breathing issues, throat closure, significant swelling, whole body hives, severe diarrhea and vomiting, lightheadedness then inject epinephrine and seek immediate medical care afterwards. Action plan in place.   Follow up in 12 months or sooner if needed.  Follow up with PCP regarding your blood pressure.142/90 in the office.

## 2022-07-15 NOTE — Assessment & Plan Note (Signed)
Past history - 2021 skin testing was positive to shrimp, crab, lobster, apple, cantaloupe, watermelon. Interim history - no reactions.  . Continue to avoid shellfish, apple, cantaloupe, watermelon. . For mild symptoms you can take over the counter antihistamines such as Benadryl and monitor symptoms closely. If symptoms worsen or if you have severe symptoms including breathing issues, throat closure, significant swelling, whole body hives, severe diarrhea and vomiting, lightheadedness then inject epinephrine and seek immediate medical care afterwards. . Action plan in place.

## 2022-07-24 ENCOUNTER — Ambulatory Visit (INDEPENDENT_AMBULATORY_CARE_PROVIDER_SITE_OTHER): Payer: No Typology Code available for payment source

## 2022-07-24 DIAGNOSIS — J309 Allergic rhinitis, unspecified: Secondary | ICD-10-CM | POA: Diagnosis not present

## 2022-07-30 ENCOUNTER — Ambulatory Visit (INDEPENDENT_AMBULATORY_CARE_PROVIDER_SITE_OTHER): Payer: No Typology Code available for payment source

## 2022-07-30 DIAGNOSIS — J309 Allergic rhinitis, unspecified: Secondary | ICD-10-CM | POA: Diagnosis not present

## 2022-07-31 ENCOUNTER — Telehealth: Payer: Self-pay | Admitting: Allergy

## 2022-07-31 NOTE — Telephone Encounter (Signed)
Faxed renewal of authorization request to [facility], [phone] and emailed it to [email or web address].   Called to speak to patient in regards to authorization - no answer - lvm.

## 2022-08-06 ENCOUNTER — Ambulatory Visit (INDEPENDENT_AMBULATORY_CARE_PROVIDER_SITE_OTHER): Payer: No Typology Code available for payment source

## 2022-08-06 DIAGNOSIS — J309 Allergic rhinitis, unspecified: Secondary | ICD-10-CM

## 2022-08-08 NOTE — Telephone Encounter (Signed)
Re-faxed authorization request.

## 2022-08-12 ENCOUNTER — Ambulatory Visit (INDEPENDENT_AMBULATORY_CARE_PROVIDER_SITE_OTHER): Payer: No Typology Code available for payment source | Admitting: *Deleted

## 2022-08-12 DIAGNOSIS — J309 Allergic rhinitis, unspecified: Secondary | ICD-10-CM | POA: Diagnosis not present

## 2022-08-21 ENCOUNTER — Ambulatory Visit (INDEPENDENT_AMBULATORY_CARE_PROVIDER_SITE_OTHER): Payer: No Typology Code available for payment source

## 2022-08-21 DIAGNOSIS — J309 Allergic rhinitis, unspecified: Secondary | ICD-10-CM

## 2022-08-27 ENCOUNTER — Ambulatory Visit (INDEPENDENT_AMBULATORY_CARE_PROVIDER_SITE_OTHER): Payer: No Typology Code available for payment source

## 2022-08-27 DIAGNOSIS — J309 Allergic rhinitis, unspecified: Secondary | ICD-10-CM | POA: Diagnosis not present

## 2022-09-04 ENCOUNTER — Ambulatory Visit (INDEPENDENT_AMBULATORY_CARE_PROVIDER_SITE_OTHER): Payer: No Typology Code available for payment source

## 2022-09-04 DIAGNOSIS — J309 Allergic rhinitis, unspecified: Secondary | ICD-10-CM

## 2022-09-05 DIAGNOSIS — J301 Allergic rhinitis due to pollen: Secondary | ICD-10-CM

## 2022-09-05 NOTE — Progress Notes (Signed)
VIALS EXP 09-06-23 

## 2022-09-06 DIAGNOSIS — J3089 Other allergic rhinitis: Secondary | ICD-10-CM

## 2022-09-11 ENCOUNTER — Ambulatory Visit (INDEPENDENT_AMBULATORY_CARE_PROVIDER_SITE_OTHER): Payer: No Typology Code available for payment source

## 2022-09-11 DIAGNOSIS — J309 Allergic rhinitis, unspecified: Secondary | ICD-10-CM | POA: Diagnosis not present

## 2022-09-12 NOTE — Telephone Encounter (Signed)
Received authorization for patient. However authorization is dated for December 2023 to December 2024.   [place] [phone] ext [phone]. Requested to speak to someone who could backdate authorization as I had sent request for renewal of services back in November. Was directed to speak to nurse navigator [place], [phone]  Spoke to [facility], requested for authorization to be backdated to cover injections starting 08-06-2022. Kelli requested for notes to be sent for visits since that date. I sent notes for all visits to our office since 08-06-2022 to [email or web address]) via secure email.   [facility] emailed me back with authorization dated for 08-06-2022 to 08-07-2023. I have updated authorization in system.

## 2022-09-18 ENCOUNTER — Ambulatory Visit (INDEPENDENT_AMBULATORY_CARE_PROVIDER_SITE_OTHER): Payer: No Typology Code available for payment source

## 2022-09-18 DIAGNOSIS — J309 Allergic rhinitis, unspecified: Secondary | ICD-10-CM | POA: Diagnosis not present

## 2022-09-26 ENCOUNTER — Ambulatory Visit (INDEPENDENT_AMBULATORY_CARE_PROVIDER_SITE_OTHER): Payer: No Typology Code available for payment source

## 2022-09-26 DIAGNOSIS — J309 Allergic rhinitis, unspecified: Secondary | ICD-10-CM

## 2022-10-02 ENCOUNTER — Ambulatory Visit (INDEPENDENT_AMBULATORY_CARE_PROVIDER_SITE_OTHER): Payer: No Typology Code available for payment source

## 2022-10-02 DIAGNOSIS — J309 Allergic rhinitis, unspecified: Secondary | ICD-10-CM

## 2022-10-08 ENCOUNTER — Ambulatory Visit (INDEPENDENT_AMBULATORY_CARE_PROVIDER_SITE_OTHER): Payer: No Typology Code available for payment source

## 2022-10-08 DIAGNOSIS — J309 Allergic rhinitis, unspecified: Secondary | ICD-10-CM

## 2022-10-18 ENCOUNTER — Ambulatory Visit (INDEPENDENT_AMBULATORY_CARE_PROVIDER_SITE_OTHER): Payer: No Typology Code available for payment source

## 2022-10-18 DIAGNOSIS — J309 Allergic rhinitis, unspecified: Secondary | ICD-10-CM | POA: Diagnosis not present

## 2022-10-23 ENCOUNTER — Ambulatory Visit (INDEPENDENT_AMBULATORY_CARE_PROVIDER_SITE_OTHER): Payer: No Typology Code available for payment source

## 2022-10-23 DIAGNOSIS — J309 Allergic rhinitis, unspecified: Secondary | ICD-10-CM

## 2022-10-30 ENCOUNTER — Ambulatory Visit (INDEPENDENT_AMBULATORY_CARE_PROVIDER_SITE_OTHER): Payer: No Typology Code available for payment source

## 2022-10-30 DIAGNOSIS — J309 Allergic rhinitis, unspecified: Secondary | ICD-10-CM

## 2022-11-05 ENCOUNTER — Ambulatory Visit (INDEPENDENT_AMBULATORY_CARE_PROVIDER_SITE_OTHER): Payer: No Typology Code available for payment source

## 2022-11-05 DIAGNOSIS — J309 Allergic rhinitis, unspecified: Secondary | ICD-10-CM

## 2022-11-13 ENCOUNTER — Ambulatory Visit (INDEPENDENT_AMBULATORY_CARE_PROVIDER_SITE_OTHER): Payer: No Typology Code available for payment source

## 2022-11-13 DIAGNOSIS — J309 Allergic rhinitis, unspecified: Secondary | ICD-10-CM

## 2022-11-20 ENCOUNTER — Ambulatory Visit (INDEPENDENT_AMBULATORY_CARE_PROVIDER_SITE_OTHER): Payer: No Typology Code available for payment source

## 2022-11-20 DIAGNOSIS — J309 Allergic rhinitis, unspecified: Secondary | ICD-10-CM | POA: Diagnosis not present

## 2022-11-25 ENCOUNTER — Ambulatory Visit (INDEPENDENT_AMBULATORY_CARE_PROVIDER_SITE_OTHER): Payer: No Typology Code available for payment source | Admitting: *Deleted

## 2022-11-25 DIAGNOSIS — J309 Allergic rhinitis, unspecified: Secondary | ICD-10-CM | POA: Diagnosis not present

## 2022-11-25 NOTE — Progress Notes (Signed)
VIALS EXP 11-25-23

## 2022-11-26 DIAGNOSIS — J301 Allergic rhinitis due to pollen: Secondary | ICD-10-CM | POA: Diagnosis not present

## 2022-11-27 DIAGNOSIS — J3089 Other allergic rhinitis: Secondary | ICD-10-CM | POA: Diagnosis not present

## 2022-12-02 ENCOUNTER — Ambulatory Visit (INDEPENDENT_AMBULATORY_CARE_PROVIDER_SITE_OTHER): Payer: No Typology Code available for payment source | Admitting: *Deleted

## 2022-12-02 DIAGNOSIS — J309 Allergic rhinitis, unspecified: Secondary | ICD-10-CM | POA: Diagnosis not present

## 2022-12-11 ENCOUNTER — Ambulatory Visit (INDEPENDENT_AMBULATORY_CARE_PROVIDER_SITE_OTHER): Payer: No Typology Code available for payment source

## 2022-12-11 DIAGNOSIS — J309 Allergic rhinitis, unspecified: Secondary | ICD-10-CM

## 2022-12-23 ENCOUNTER — Ambulatory Visit (INDEPENDENT_AMBULATORY_CARE_PROVIDER_SITE_OTHER): Payer: No Typology Code available for payment source | Admitting: *Deleted

## 2022-12-23 DIAGNOSIS — J309 Allergic rhinitis, unspecified: Secondary | ICD-10-CM | POA: Diagnosis not present

## 2023-01-06 ENCOUNTER — Ambulatory Visit (INDEPENDENT_AMBULATORY_CARE_PROVIDER_SITE_OTHER): Payer: No Typology Code available for payment source

## 2023-01-06 DIAGNOSIS — J309 Allergic rhinitis, unspecified: Secondary | ICD-10-CM

## 2023-01-14 ENCOUNTER — Ambulatory Visit (INDEPENDENT_AMBULATORY_CARE_PROVIDER_SITE_OTHER): Payer: No Typology Code available for payment source

## 2023-01-14 DIAGNOSIS — J309 Allergic rhinitis, unspecified: Secondary | ICD-10-CM

## 2023-01-27 ENCOUNTER — Ambulatory Visit (INDEPENDENT_AMBULATORY_CARE_PROVIDER_SITE_OTHER): Payer: No Typology Code available for payment source | Admitting: *Deleted

## 2023-01-27 DIAGNOSIS — J309 Allergic rhinitis, unspecified: Secondary | ICD-10-CM

## 2023-02-04 ENCOUNTER — Ambulatory Visit (INDEPENDENT_AMBULATORY_CARE_PROVIDER_SITE_OTHER): Payer: No Typology Code available for payment source

## 2023-02-04 DIAGNOSIS — J309 Allergic rhinitis, unspecified: Secondary | ICD-10-CM | POA: Diagnosis not present

## 2023-02-11 ENCOUNTER — Ambulatory Visit (INDEPENDENT_AMBULATORY_CARE_PROVIDER_SITE_OTHER): Payer: No Typology Code available for payment source

## 2023-02-11 DIAGNOSIS — J309 Allergic rhinitis, unspecified: Secondary | ICD-10-CM | POA: Diagnosis not present

## 2023-02-25 ENCOUNTER — Ambulatory Visit (INDEPENDENT_AMBULATORY_CARE_PROVIDER_SITE_OTHER): Payer: No Typology Code available for payment source | Admitting: *Deleted

## 2023-02-25 DIAGNOSIS — J309 Allergic rhinitis, unspecified: Secondary | ICD-10-CM | POA: Diagnosis not present

## 2023-03-12 ENCOUNTER — Ambulatory Visit (INDEPENDENT_AMBULATORY_CARE_PROVIDER_SITE_OTHER): Payer: No Typology Code available for payment source

## 2023-03-12 DIAGNOSIS — J309 Allergic rhinitis, unspecified: Secondary | ICD-10-CM | POA: Diagnosis not present

## 2023-03-31 ENCOUNTER — Ambulatory Visit (INDEPENDENT_AMBULATORY_CARE_PROVIDER_SITE_OTHER): Payer: No Typology Code available for payment source

## 2023-03-31 DIAGNOSIS — J309 Allergic rhinitis, unspecified: Secondary | ICD-10-CM

## 2023-04-09 DIAGNOSIS — J301 Allergic rhinitis due to pollen: Secondary | ICD-10-CM | POA: Diagnosis not present

## 2023-04-09 NOTE — Progress Notes (Signed)
VIALS EXP 04-08-24

## 2023-04-10 DIAGNOSIS — J3081 Allergic rhinitis due to animal (cat) (dog) hair and dander: Secondary | ICD-10-CM | POA: Diagnosis not present

## 2023-04-16 ENCOUNTER — Ambulatory Visit (INDEPENDENT_AMBULATORY_CARE_PROVIDER_SITE_OTHER): Payer: No Typology Code available for payment source | Admitting: *Deleted

## 2023-04-16 DIAGNOSIS — J309 Allergic rhinitis, unspecified: Secondary | ICD-10-CM

## 2023-04-30 ENCOUNTER — Ambulatory Visit (INDEPENDENT_AMBULATORY_CARE_PROVIDER_SITE_OTHER): Payer: No Typology Code available for payment source

## 2023-04-30 DIAGNOSIS — J309 Allergic rhinitis, unspecified: Secondary | ICD-10-CM | POA: Diagnosis not present

## 2023-05-12 ENCOUNTER — Ambulatory Visit (INDEPENDENT_AMBULATORY_CARE_PROVIDER_SITE_OTHER): Payer: No Typology Code available for payment source | Admitting: *Deleted

## 2023-05-12 DIAGNOSIS — J309 Allergic rhinitis, unspecified: Secondary | ICD-10-CM | POA: Diagnosis not present

## 2023-06-03 ENCOUNTER — Ambulatory Visit (INDEPENDENT_AMBULATORY_CARE_PROVIDER_SITE_OTHER): Payer: No Typology Code available for payment source

## 2023-06-03 DIAGNOSIS — J309 Allergic rhinitis, unspecified: Secondary | ICD-10-CM

## 2023-06-16 ENCOUNTER — Ambulatory Visit (INDEPENDENT_AMBULATORY_CARE_PROVIDER_SITE_OTHER): Payer: No Typology Code available for payment source

## 2023-06-16 DIAGNOSIS — J309 Allergic rhinitis, unspecified: Secondary | ICD-10-CM | POA: Diagnosis not present

## 2023-06-24 ENCOUNTER — Ambulatory Visit (INDEPENDENT_AMBULATORY_CARE_PROVIDER_SITE_OTHER): Payer: No Typology Code available for payment source | Admitting: *Deleted

## 2023-06-24 DIAGNOSIS — J309 Allergic rhinitis, unspecified: Secondary | ICD-10-CM

## 2023-07-01 ENCOUNTER — Ambulatory Visit (INDEPENDENT_AMBULATORY_CARE_PROVIDER_SITE_OTHER): Payer: No Typology Code available for payment source

## 2023-07-01 DIAGNOSIS — J309 Allergic rhinitis, unspecified: Secondary | ICD-10-CM | POA: Diagnosis not present

## 2023-07-03 ENCOUNTER — Other Ambulatory Visit: Payer: Self-pay | Admitting: Student

## 2023-07-03 DIAGNOSIS — G43019 Migraine without aura, intractable, without status migrainosus: Secondary | ICD-10-CM

## 2023-07-03 DIAGNOSIS — G932 Benign intracranial hypertension: Secondary | ICD-10-CM

## 2023-07-04 ENCOUNTER — Other Ambulatory Visit: Payer: Self-pay | Admitting: Student

## 2023-07-04 DIAGNOSIS — G932 Benign intracranial hypertension: Secondary | ICD-10-CM

## 2023-07-08 NOTE — Progress Notes (Unsigned)
Follow Up Note  RE: Justin [name] MRN: [id number] DOB: 26-Aug-1981 Date of Office Visit: 07/09/2023  Referring provider: Orland [name]* Primary care provider: Jeris Penta, PA-C  Chief Complaint: No chief complaint on file.  History of Present Illness: I had the pleasure of seeing Justin [name] for a follow up visit at the [facility] on 07/08/2023. He is a 42 y.o. male, who is being followed for allergic rhinoconjunctivitis on AIT, food allergy, oral allergy syndrome. His previous allergy office visit was on 07/15/2022 with Dr. Selena [name]. Today is a regular follow up visit.  Discussed the use of AI scribe software for clinical note transcription with the patient, who gave verbal consent to proceed.  History of Present Illness            Seasonal and perennial allergic rhinoconjunctivitis Past history - 2021 skin testing was positive to grass, ragweed, weeds, trees, dust mites, cat, dog.  Borderline positive to mold. On AIT for about 1 year.  Interim history - restarted AIT on 02/05/2022 (G-DM-C-D and RW-W-T) with some localized reactions.  Continue environmental control measures.  Continue Singulair (montelukast) 10mg  daily at night. Use over the counter antihistamines such as Zyrtec (cetirizine), Claritin (loratadine), Allegra (fexofenadine), or Xyzal (levocetirizine) daily as needed. May take twice a day during allergy flares. May switch antihistamines every few months. Use Flonase (fluticasone) nasal spray 1 spray per nostril twice a day as needed for nasal congestion.  Use Atrovent (ipratropium) 0.03% 1-2 sprays per nostril twice a day as needed for runny nose/drainage. .Use olopatadine eye drops 0.1% twice a day as needed for itchy/watery eyes. May use refresh eye drops as needed additionally.  Continue allergy injections - given today. You may take an extra allergy medication on the days of your injections to help with the localized itching/rash.    Food allergy Past history - 2021 skin testing was positive to shrimp, crab, lobster, apple, cantaloupe, watermelon. Interim history - no reactions.  Continue to avoid shellfish, apple, cantaloupe, watermelon. For mild symptoms you can take over the counter antihistamines such as Benadryl and monitor symptoms closely. If symptoms worsen or if you have severe symptoms including breathing issues, throat closure, significant swelling, whole body hives, severe diarrhea and vomiting, lightheadedness then inject epinephrine and seek immediate medical care afterwards. Action plan in place.    Pollen-food allergy syndrome See assessment and plan as above.   Elevated blood pressure reading Elevated reading at 142/90 and repeat at 138/92. Follow up with PCP regarding this.   Assessment and Plan: [name] is a 42 y.o. male with: Seasonal allergic rhinitis due to pollen Allergic rhinitis due to dust mite Allergic rhinitis due to animal dander Allergic conjunctivitis of both eyes Past history - 2021 skin testing positive to grass, ragweed, weeds, trees, dust mites, cat, dog.  Borderline positive to mold. On AIT for about 1 year. Restarted AIT on 02/05/2022 (G-DM-C-D and RW-W-T). Interim history -    Anaphylactic reaction due to food, subsequent encounter Oral allergy syndrome, subsequent encounter Past history - 2021 skin testing was positive to shrimp, crab, lobster, apple, cantaloupe, watermelon. Interim history -    Assessment and Plan              No follow-ups on file.  No orders of the defined types were placed in this encounter.  Lab Orders  No laboratory test(s) ordered today    Diagnostics: Spirometry:  Tracings reviewed. His effort: {Blank single:19197::"Good reproducible efforts.","It was  hard to get consistent efforts and there is a question as to whether this reflects a maximal maneuver.","Poor effort, data can not be interpreted."} FVC: ***L FEV1: ***L, ***%  predicted FEV1/FVC ratio: ***% Interpretation: {Blank single:19197::"Spirometry consistent with mild obstructive disease","Spirometry consistent with moderate obstructive disease","Spirometry consistent with severe obstructive disease","Spirometry consistent with possible restrictive disease","Spirometry consistent with mixed obstructive and restrictive disease","Spirometry uninterpretable due to technique","Spirometry consistent with normal pattern","No overt abnormalities noted given today's efforts"}.  Please see scanned spirometry results for details.  Skin Testing: {Blank single:19197::"Select foods","Environmental allergy panel","Environmental allergy panel and select foods","Food allergy panel","None","Deferred due to recent antihistamines use"}. *** Results discussed with patient/family.   Medication List:  Current Outpatient Medications  Medication Sig Dispense Refill  . cetirizine (ZYRTEC ALLERGY) 10 MG tablet Take 1 tablet (10 mg total) by mouth daily. 90 tablet 2  . EPINEPHrine 0.3 mg/0.3 mL IJ SOAJ injection Inject 0.3 mg into the muscle as needed for anaphylaxis. 1 each 2  . fluticasone (FLONASE) 50 MCG/ACT nasal spray Place 1 spray into both nostrils 2 (two) times daily as needed (nasal congestion). 48 g 2  . ipratropium (ATROVENT) 0.03 % nasal spray Place 1-2 sprays into both nostrils 2 (two) times daily as needed (nasal drainage). 90 mL 2  . montelukast (SINGULAIR) 10 MG tablet Take 1 tablet (10 mg total) by mouth at bedtime. 90 tablet 2  . olopatadine (PATANOL) 0.1 % ophthalmic solution Place 1 drop into both eyes 2 (two) times daily. 5 mL 5  . olopatadine (PATANOL) 0.1 % ophthalmic solution Instill one drop into each eye twice daily for itchy eyes 15 mL 1  . promethazine (PHENERGAN) 25 MG tablet TAKE TWO TABLETS BY MOUTH TWICE A DAY AS NEEDED FOR NAUSEA OR "SLEEP OFF"    . SUMAtriptan (IMITREX) 6 MG/0.5ML SOSY injection INJECT 6MG  (ONE SINGLE DOSE AUTOINJECTOR) SUBCUTANEOUSLY  TWICE A DAY AS NEEDED FOR SEVERE "CLUSTER" HEADACHE    . topiramate (TOPAMAX) 100 MG tablet TAKE ONE TABLET BY MOUTH TWICE A DAY AND TAKE TWO TABLETS AT BEDTIME (*NOTE DOSE)    . triamterene-hydrochlorothiazide (DYAZIDE) 37.5-25 MG capsule Take 1 capsule by mouth daily.     No current facility-administered medications for this visit.   Allergies: Allergies  Allergen Reactions  . Iodides Itching and Swelling  . Other Itching and Swelling  . Shrimp [Shellfish Allergy] Hives and Rash   I reviewed his past medical history, social history, family history, and environmental history and no significant changes have been reported from his previous visit.  Review of Systems  Constitutional:  Negative for appetite change, chills, fever and unexpected weight change.  HENT:  Positive for congestion and rhinorrhea.   Eyes:  Positive for itching.  Respiratory:  Negative for cough, chest tightness, shortness of breath and wheezing.   Gastrointestinal:  Negative for abdominal pain.  Skin:  Negative for rash.  Allergic/Immunologic: Positive for environmental allergies and food allergies.  Neurological:  Negative for headaches.   Objective: There were no vitals taken for this visit. There is no height or weight on file to calculate BMI. Physical Exam Vitals and nursing note reviewed.  Constitutional:      Appearance: Normal appearance. He is well-developed.  HENT:     Head: Normocephalic and atraumatic.     Right Ear: Tympanic membrane and external ear normal.     Left Ear: Tympanic membrane and external ear normal.     Nose: Nose normal.     Mouth/Throat:     Mouth: Mucous  membranes are moist.     Pharynx: Oropharynx is clear.  Eyes:     Conjunctiva/sclera: Conjunctivae normal.  Cardiovascular:     Rate and Rhythm: Normal rate and regular rhythm.     Heart sounds: Normal heart sounds. No murmur heard. Pulmonary:     Effort: Pulmonary effort is normal.     Breath sounds: Normal breath  sounds. No wheezing, rhonchi or rales.  Musculoskeletal:     Cervical back: Neck supple.  Skin:    General: Skin is warm.     Findings: No rash.  Neurological:     Mental Status: He is alert and oriented to person, place, and time.  Psychiatric:        Behavior: Behavior normal.  Previous notes and tests were reviewed. The plan was reviewed with the patient/family, and all questions/concerned were addressed.  It was my pleasure to see Justin [name] today and participate in his care. Please feel free to contact me with any questions or concerns.  Sincerely,  Wyline Mood, DO Allergy & Immunology  [facility] office: [phone] [facility] office: [phone]

## 2023-07-09 ENCOUNTER — Ambulatory Visit: Payer: No Typology Code available for payment source | Admitting: Allergy

## 2023-07-09 ENCOUNTER — Other Ambulatory Visit: Payer: Self-pay

## 2023-07-09 ENCOUNTER — Encounter: Payer: Self-pay | Admitting: Allergy

## 2023-07-09 ENCOUNTER — Ambulatory Visit (INDEPENDENT_AMBULATORY_CARE_PROVIDER_SITE_OTHER): Payer: No Typology Code available for payment source | Admitting: *Deleted

## 2023-07-09 VITALS — BP 130/100 | HR 73 | Temp 98.3°F | Resp 16 | Wt 248.4 lb

## 2023-07-09 DIAGNOSIS — J3089 Other allergic rhinitis: Secondary | ICD-10-CM

## 2023-07-09 DIAGNOSIS — J3081 Allergic rhinitis due to animal (cat) (dog) hair and dander: Secondary | ICD-10-CM

## 2023-07-09 DIAGNOSIS — J309 Allergic rhinitis, unspecified: Secondary | ICD-10-CM

## 2023-07-09 DIAGNOSIS — T7800XD Anaphylactic reaction due to unspecified food, subsequent encounter: Secondary | ICD-10-CM

## 2023-07-09 DIAGNOSIS — H1013 Acute atopic conjunctivitis, bilateral: Secondary | ICD-10-CM

## 2023-07-09 DIAGNOSIS — J301 Allergic rhinitis due to pollen: Secondary | ICD-10-CM

## 2023-07-09 DIAGNOSIS — T781XXD Other adverse food reactions, not elsewhere classified, subsequent encounter: Secondary | ICD-10-CM

## 2023-07-09 DIAGNOSIS — R030 Elevated blood-pressure reading, without diagnosis of hypertension: Secondary | ICD-10-CM

## 2023-07-09 MED ORDER — CETIRIZINE HCL 10 MG PO TABS: 10.0000 mg | ORAL_TABLET | Freq: Every day | ORAL | 2 refills | Status: DC

## 2023-07-09 NOTE — Patient Instructions (Addendum)
Allergic rhinoconjunctivitis 2021 skin testing was positive to grass, ragweed, weeds, trees, dust mites, cat, dog.  Borderline positive to mold.  Continue environmental control measures.  Continue Singulair (montelukast) 10mg  daily at night. Use over the counter antihistamines such as Zyrtec (cetirizine), Claritin (loratadine), Allegra (fexofenadine), or Xyzal (levocetirizine) daily as needed. May take twice a day during allergy flares. May switch antihistamines every few months. Use Flonase (fluticasone) nasal spray 1 spray per nostril twice a day as needed for nasal congestion.  Use Atrovent (ipratropium) 0.03% 1-2 sprays per nostril twice a day as needed for runny nose/drainage. Use olopatadine eye drops 0.1% twice a day as needed for itchy/watery eyes.  May use refresh eye drops as needed additionally.  Continue allergy injections - given today. You may take an extra allergy medication on the days of your injections to help with the localized itching/rash.  Food allergy 2021 skin testing was positive to shrimp, crab, lobster, apple, cantaloupe, watermelon. For mild symptoms you can take over the counter antihistamines such as Benadryl and monitor symptoms closely. If symptoms worsen or if you have severe symptoms including breathing issues, throat closure, significant swelling, whole body hives, severe diarrhea and vomiting, lightheadedness then inject epinephrine and seek immediate medical care afterwards. Action plan in place.   Follow up in 12 months or sooner if needed.   Elevated blood pressure  Blood pressure reading was high in our office today. Vitals:   07/09/23 1039  BP: (!) 132/90   Please follow up with PCP regarding this.

## 2023-07-17 ENCOUNTER — Ambulatory Visit: Payer: No Typology Code available for payment source

## 2023-07-21 ENCOUNTER — Ambulatory Visit (INDEPENDENT_AMBULATORY_CARE_PROVIDER_SITE_OTHER): Payer: No Typology Code available for payment source

## 2023-07-21 DIAGNOSIS — J309 Allergic rhinitis, unspecified: Secondary | ICD-10-CM

## 2023-07-25 ENCOUNTER — Telehealth: Payer: Self-pay | Admitting: Allergy

## 2023-07-25 NOTE — Telephone Encounter (Signed)
Patient's authorization, [id number] expires 08-06-2023.   Faxed renewal of authorization request to [facility], [phone] and emailed it to [email or web address].   Spoke to patient about expiration date and to follow up with [facility]. Patient verbalized understanding.

## 2023-08-05 ENCOUNTER — Ambulatory Visit (INDEPENDENT_AMBULATORY_CARE_PROVIDER_SITE_OTHER): Payer: No Typology Code available for payment source | Admitting: *Deleted

## 2023-08-05 DIAGNOSIS — J309 Allergic rhinitis, unspecified: Secondary | ICD-10-CM

## 2023-08-05 NOTE — Telephone Encounter (Signed)
I called and left a voicemail for the patients nurse navigator Harvin Hazel [phone] to see if a new authorization has been issued.    I called the [facility] back and this is the updated [place] Auth:  [id number] Valid: 08/05/2023-08/04/2024  It is being faxed to our office today.

## 2023-09-02 ENCOUNTER — Ambulatory Visit (INDEPENDENT_AMBULATORY_CARE_PROVIDER_SITE_OTHER): Payer: No Typology Code available for payment source

## 2023-09-02 DIAGNOSIS — J309 Allergic rhinitis, unspecified: Secondary | ICD-10-CM | POA: Diagnosis not present

## 2023-09-30 ENCOUNTER — Ambulatory Visit (INDEPENDENT_AMBULATORY_CARE_PROVIDER_SITE_OTHER): Payer: No Typology Code available for payment source | Admitting: *Deleted

## 2023-09-30 DIAGNOSIS — J309 Allergic rhinitis, unspecified: Secondary | ICD-10-CM

## 2023-11-17 ENCOUNTER — Ambulatory Visit (INDEPENDENT_AMBULATORY_CARE_PROVIDER_SITE_OTHER): Payer: No Typology Code available for payment source

## 2023-11-17 DIAGNOSIS — J309 Allergic rhinitis, unspecified: Secondary | ICD-10-CM

## 2023-11-18 DIAGNOSIS — J301 Allergic rhinitis due to pollen: Secondary | ICD-10-CM | POA: Diagnosis not present

## 2023-11-18 NOTE — Progress Notes (Signed)
 VIAL ONE MADE 11-18-23. EXP 11-17-24

## 2023-11-19 DIAGNOSIS — J3081 Allergic rhinitis due to animal (cat) (dog) hair and dander: Secondary | ICD-10-CM | POA: Diagnosis not present

## 2023-11-19 NOTE — Progress Notes (Signed)
 VIAL TWO MADE 11-19-23. EXP 11-18-24

## 2023-11-24 ENCOUNTER — Ambulatory Visit (INDEPENDENT_AMBULATORY_CARE_PROVIDER_SITE_OTHER): Admitting: *Deleted

## 2023-11-24 DIAGNOSIS — J309 Allergic rhinitis, unspecified: Secondary | ICD-10-CM | POA: Diagnosis not present

## 2023-11-26 ENCOUNTER — Other Ambulatory Visit: Payer: Self-pay | Admitting: Student

## 2023-11-26 DIAGNOSIS — R531 Weakness: Secondary | ICD-10-CM

## 2023-11-26 DIAGNOSIS — R2689 Other abnormalities of gait and mobility: Secondary | ICD-10-CM

## 2023-12-22 ENCOUNTER — Ambulatory Visit (INDEPENDENT_AMBULATORY_CARE_PROVIDER_SITE_OTHER): Admitting: *Deleted

## 2023-12-22 DIAGNOSIS — J309 Allergic rhinitis, unspecified: Secondary | ICD-10-CM | POA: Diagnosis not present

## 2023-12-25 ENCOUNTER — Other Ambulatory Visit: Payer: Self-pay | Admitting: Student

## 2023-12-25 DIAGNOSIS — R531 Weakness: Secondary | ICD-10-CM

## 2023-12-25 DIAGNOSIS — R2689 Other abnormalities of gait and mobility: Secondary | ICD-10-CM

## 2024-01-01 ENCOUNTER — Ambulatory Visit
Admission: RE | Admit: 2024-01-01 | Discharge: 2024-01-01 | Disposition: A | Discharge status: Home / Self Care | Source: Ambulatory Visit | Attending: Student | Admitting: Student

## 2024-01-01 DIAGNOSIS — R531 Weakness: Secondary | ICD-10-CM | POA: Insufficient documentation

## 2024-01-01 DIAGNOSIS — R2689 Other abnormalities of gait and mobility: Secondary | ICD-10-CM | POA: Insufficient documentation

## 2024-01-26 ENCOUNTER — Ambulatory Visit (INDEPENDENT_AMBULATORY_CARE_PROVIDER_SITE_OTHER)

## 2024-01-26 DIAGNOSIS — J309 Allergic rhinitis, unspecified: Secondary | ICD-10-CM | POA: Diagnosis not present

## 2024-02-04 ENCOUNTER — Ambulatory Visit (INDEPENDENT_AMBULATORY_CARE_PROVIDER_SITE_OTHER)

## 2024-02-04 DIAGNOSIS — J309 Allergic rhinitis, unspecified: Secondary | ICD-10-CM | POA: Diagnosis not present

## 2024-02-13 ENCOUNTER — Ambulatory Visit (INDEPENDENT_AMBULATORY_CARE_PROVIDER_SITE_OTHER)

## 2024-02-13 DIAGNOSIS — J309 Allergic rhinitis, unspecified: Secondary | ICD-10-CM | POA: Diagnosis not present

## 2024-02-26 ENCOUNTER — Ambulatory Visit (INDEPENDENT_AMBULATORY_CARE_PROVIDER_SITE_OTHER)

## 2024-02-26 DIAGNOSIS — J309 Allergic rhinitis, unspecified: Secondary | ICD-10-CM | POA: Diagnosis not present

## 2024-03-05 ENCOUNTER — Ambulatory Visit (INDEPENDENT_AMBULATORY_CARE_PROVIDER_SITE_OTHER)

## 2024-03-05 DIAGNOSIS — J309 Allergic rhinitis, unspecified: Secondary | ICD-10-CM | POA: Diagnosis not present

## 2024-04-09 ENCOUNTER — Ambulatory Visit

## 2024-04-09 DIAGNOSIS — J309 Allergic rhinitis, unspecified: Secondary | ICD-10-CM

## 2024-05-07 ENCOUNTER — Ambulatory Visit (INDEPENDENT_AMBULATORY_CARE_PROVIDER_SITE_OTHER): Admitting: *Deleted

## 2024-05-07 DIAGNOSIS — J309 Allergic rhinitis, unspecified: Secondary | ICD-10-CM | POA: Diagnosis not present

## 2024-05-19 DIAGNOSIS — H903 Sensorineural hearing loss, bilateral: Secondary | ICD-10-CM | POA: Insufficient documentation

## 2024-05-20 ENCOUNTER — Encounter: Payer: Self-pay | Admitting: Physician Assistant

## 2024-05-20 NOTE — Progress Notes (Deleted)
 Referring Physician:  Torress, Corean SQUIBB, PA-C [facility] [address] [place],  [place] [address]  Primary Physician:  Torress, Corean SQUIBB, PA-C  History of Present Illness: 05/20/2024 [name] is here today with a chief complaint of ***  Symptoms include imbalance, right side weakness, numbness, and tingling . No neck pain? right sided fingers and feet, Dropping things, migraine  Duration: *** Location: *** Quality: *** Severity: ***  Precipitating: aggravated by *** Modifying factors: made better by *** Weakness: none Timing: *** Bowel/Bladder Dysfunction: none  Conservative measures:  Physical therapy: *** has participated in but when and where? Multimodal medical therapy including regular antiinflammatories: *** ? Injections: *** no epidural steroid injections 07/18/16-nerve block  Past Surgery: ***no spine surgery  Justin [name] has ***no symptoms of cervical myelopathy.  The symptoms are causing a significant impact on the patient's life.   Review of Systems:  A 10 point review of systems is negative, except for the pertinent positives and negatives detailed in the HPI.  Past Medical History: Past Medical History:  Diagnosis Date   Migraine    Postconcussion syndrome    Recurrent upper respiratory infection (URI)    Seasonal and perennial allergic rhinoconjunctivitis 04/27/2020   TBI (traumatic brain injury) [facility])     Past Surgical History: Past Surgical History:  Procedure Laterality Date   SHOULDER ACROMIOPLASTY      Allergies: Allergies as of 05/26/2024 - Review Complete 07/09/2023  Allergen Reaction Noted   Iodides Itching and Swelling 10/21/2012   Other Itching and Swelling 09/28/2015   Shrimp [shellfish allergy ] Hives and Rash 12/08/2013    Medications: Outpatient Encounter Medications as of 05/26/2024  Medication Sig   cetirizine  (ZYRTEC  ALLERGY ) 10 MG tablet Take 1 tablet (10 mg total) by mouth daily.   EPINEPHrine  0.3  mg/0.3 mL IJ SOAJ injection Inject 0.3 mg into the muscle as needed for anaphylaxis.   fluticasone  (FLONASE ) 50 MCG/ACT nasal spray Place 1 spray into both nostrils 2 (two) times daily as needed (nasal congestion).   ipratropium (ATROVENT ) 0.03 % nasal spray Place 1-2 sprays into both nostrils 2 (two) times daily as needed (nasal drainage).   montelukast  (SINGULAIR ) 10 MG tablet Take 1 tablet (10 mg total) by mouth at bedtime.   olopatadine  (PATANOL) 0.1 % ophthalmic solution Place 1 drop into both eyes 2 (two) times daily.   promethazine (PHENERGAN) 25 MG tablet TAKE TWO TABLETS BY MOUTH TWICE A DAY AS NEEDED FOR NAUSEA OR SLEEP OFF   SUMAtriptan  (IMITREX ) 6 MG/0.5ML SOSY injection INJECT 6MG  (ONE SINGLE DOSE AUTOINJECTOR) SUBCUTANEOUSLY TWICE A DAY AS NEEDED FOR SEVERE CLUSTER HEADACHE   topiramate (TOPAMAX) 100 MG tablet TAKE ONE TABLET BY MOUTH TWICE A DAY AND TAKE TWO TABLETS AT BEDTIME (*NOTE DOSE)   triamterene-hydrochlorothiazide (DYAZIDE) 37.5-25 MG capsule Take 1 capsule by mouth daily.   No facility-administered encounter medications on file as of 05/26/2024.    Social History: Social History   Tobacco Use   Smoking status: Former   Smokeless tobacco: Never  Advertising account planner   Vaping status: Never Used  Substance Use Topics   Alcohol use: No   Drug use: No    Family Medical History: Family History  Problem Relation Age of Onset   Eczema Mother    Allergic rhinitis Mother    Allergic rhinitis Sister    Asthma Sister     Physical Examination: @VITALWITHPAIN @  General: Patient is well developed, well nourished, calm, collected, and in no apparent distress. Attention  to examination is appropriate.  Psychiatric: Patient is non-anxious.  Head:  Pupils equal, round, and reactive to light.  ENT:  Oral mucosa appears well hydrated.  Neck:   Supple.  ***Full range of motion.  Respiratory: Patient is breathing without any difficulty.  Extremities: No  edema.  Vascular: Palpable dorsal pedal pulses.  Skin:   On exposed skin, there are no abnormal skin lesions.  NEUROLOGICAL:     Awake, alert, oriented to person, place, and time.  Speech is clear and fluent. Fund of knowledge is appropriate.   Cranial Nerves: Pupils equal round and reactive to light.  Facial tone is symmetric.  Facial sensation is symmetric.  ROM of spine: ***full.  Palpation of spine: ***non tender.    Strength: Side Biceps Triceps Deltoid Interossei Grip Wrist Ext. Wrist Flex.  R 5 5 5 5 5 5 5   L 5 5 5 5 5 5 5    Side Iliopsoas Quads Hamstring PF DF EHL  R 5 5 5 5 5 5   L 5 5 5 5 5 5    Reflexes are ***2+ and symmetric at the biceps, triceps, brachioradialis, patella and achilles.   Hoffman's is absent.  Clonus is not present.  Toes are down-going.  Bilateral upper and lower extremity sensation is intact to light touch.    Gait is normal.   No difficulty with tandem gait.   No evidence of dysmetria noted.  Medical Decision Making  Imaging: ***  I have personally reviewed the images and agree with the above interpretation.  Assessment and Plan: Justin [name] is a pleasant 43 y.o. male with ***    Thank you for involving me in the care of this patient.   I spent a total of *** minutes in both face-to-face and non-face-to-face activities for this visit on the date of this encounter.   Lyle Decamp, PA-C Dept. of Neurosurgery

## 2024-05-26 ENCOUNTER — Ambulatory Visit: Admitting: Physician Assistant

## 2024-05-31 ENCOUNTER — Ambulatory Visit (INDEPENDENT_AMBULATORY_CARE_PROVIDER_SITE_OTHER)

## 2024-05-31 DIAGNOSIS — J309 Allergic rhinitis, unspecified: Secondary | ICD-10-CM | POA: Diagnosis not present

## 2024-06-21 ENCOUNTER — Ambulatory Visit: Admitting: Physician Assistant

## 2024-06-29 ENCOUNTER — Ambulatory Visit

## 2024-06-29 DIAGNOSIS — J309 Allergic rhinitis, unspecified: Secondary | ICD-10-CM

## 2024-07-13 ENCOUNTER — Ambulatory Visit: Admitting: Physician Assistant

## 2024-07-26 DIAGNOSIS — J301 Allergic rhinitis due to pollen: Secondary | ICD-10-CM | POA: Diagnosis not present

## 2024-07-26 NOTE — Progress Notes (Signed)
 VIALS MADE ON 07/26/24

## 2024-07-27 DIAGNOSIS — J301 Allergic rhinitis due to pollen: Secondary | ICD-10-CM | POA: Diagnosis not present

## 2024-07-27 DIAGNOSIS — J3089 Other allergic rhinitis: Secondary | ICD-10-CM | POA: Diagnosis not present

## 2024-07-27 DIAGNOSIS — J3081 Allergic rhinitis due to animal (cat) (dog) hair and dander: Secondary | ICD-10-CM | POA: Diagnosis not present

## 2024-08-03 NOTE — Progress Notes (Unsigned)
 Follow Up Note  RE: Justin [name] MRN: [id number] DOB: 11-09-1980 Date of Office Visit: 08/04/2024  Referring provider: Catrina Corean Justin [name]* Primary care provider: Catrina Corean [name], PA-C  Chief Complaint: No chief complaint on file.  History of Present Illness: I had the pleasure of seeing Justin [name] for a follow up visit at the [facility] on 08/04/2024. He is a 43 y.o. male, who is being followed for allergic rhinoconjunctivitis on AIT and food allergy , oral allergy  syndrome. His previous allergy  office visit was on 07/09/2023 with Dr. Luke. Today is a regular follow up visit.  Discussed the use of AI scribe software for clinical note transcription with the patient, who gave verbal consent to proceed.  History of Present Illness            ***  Assessment and Plan: Eleanor is a 43 y.o. male with: Seasonal allergic rhinitis due to pollen Allergic rhinitis due to dust mite Allergic rhinitis due to animal dander Allergic conjunctivitis of both eyes Past history - 2021 skin testing positive to grass, ragweed, weeds, trees, dust mites, cat, dog.  Borderline positive to mold. On AIT for about 1 year. Restarted AIT on 02/05/2022 (G-DM-C-D and RW-W-T). Interim history -  some localized pruritus with AIT. Continue environmental control measures.  Continue Singulair  (montelukast ) 10mg  daily at night. Use over the counter antihistamines such as Zyrtec  (cetirizine ), Claritin (loratadine), Allegra (fexofenadine), or Xyzal (levocetirizine) daily as needed. May take twice a day during allergy  flares. May switch antihistamines every few months. Use Flonase  (fluticasone ) nasal spray 1 spray per nostril twice a day as needed for nasal congestion.  Use Atrovent  (ipratropium) 0.03% 1-2 sprays per nostril twice a day as needed for runny nose/drainage. Use olopatadine  eye drops 0.1% twice a day as needed for itchy/watery eyes.  May use refresh eye drops as needed  additionally.  Continue allergy  injections - given today. You may take an extra allergy  medication on the days of your injections to help with the localized itching/rash.   Anaphylactic reaction due to food, subsequent encounter Oral allergy  syndrome, subsequent encounter Past history - 2021 skin testing was positive to shrimp, crab, lobster, apple, cantaloupe, watermelon. Interim history - no reactions.  For mild symptoms you can take over the counter antihistamines such as Benadryl and monitor symptoms closely. If symptoms worsen or if you have severe symptoms including breathing issues, throat closure, significant swelling, whole body hives, severe diarrhea and vomiting, lightheadedness then inject epinephrine  and seek immediate medical care afterwards. Action plan in place.  Assessment and Plan              No follow-ups on file.  No orders of the defined types were placed in this encounter.  Lab Orders  No laboratory test(s) ordered today    Diagnostics: Spirometry:  Tracings reviewed. His effort: {Blank single:19197::Good reproducible efforts.,It was hard to get consistent efforts and there is a question as to whether this reflects a maximal maneuver.,Poor effort, data can not be interpreted.} FVC: ***L FEV1: ***L, ***% predicted FEV1/FVC ratio: ***% Interpretation: {Blank single:19197::Spirometry consistent with mild obstructive disease,Spirometry consistent with moderate obstructive disease,Spirometry consistent with severe obstructive disease,Spirometry consistent with possible restrictive disease,Spirometry consistent with mixed obstructive and restrictive disease,Spirometry uninterpretable due to technique,Spirometry consistent with normal pattern,No overt abnormalities noted given today's efforts}.  Please see scanned spirometry results for details.  Skin Testing: {Blank single:19197::Select foods,Environmental allergy  panel,Environmental  allergy  panel and select foods,Food allergy  panel,None,Deferred due to recent antihistamines  use}. *** Results discussed with patient/family.   Medication List:  Current Outpatient Medications  Medication Sig Dispense Refill  . Amantadine HCl 100 MG tablet Take 100 mg by mouth 2 (two) times daily.    . cetirizine  (ZYRTEC  ALLERGY ) 10 MG tablet Take 1 tablet (10 mg total) by mouth daily. 90 tablet 2  . EPINEPHrine  0.3 mg/0.3 mL IJ SOAJ injection Inject 0.3 mg into the muscle as needed for anaphylaxis. 1 each 2  . Erenumab-aooe 140 MG/ML SOAJ Inject 140 mg into the skin.    . fluticasone  (FLONASE ) 50 MCG/ACT nasal spray Place 1 spray into both nostrils 2 (two) times daily as needed (nasal congestion). 48 g 2  . ipratropium (ATROVENT ) 0.03 % nasal spray Place 1-2 sprays into both nostrils 2 (two) times daily as needed (nasal drainage). 90 mL 2  . losartan (COZAAR) 25 MG tablet Take 1 tablet by mouth daily.    . montelukast  (SINGULAIR ) 10 MG tablet Take 1 tablet (10 mg total) by mouth at bedtime. 90 tablet 2  . olopatadine  (PATANOL) 0.1 % ophthalmic solution Place 1 drop into both eyes 2 (two) times daily. 5 mL 5  . promethazine (PHENERGAN) 25 MG tablet TAKE TWO TABLETS BY MOUTH TWICE A DAY AS NEEDED FOR NAUSEA OR SLEEP OFF    . SUMAtriptan  (IMITREX ) 6 MG/0.5ML SOSY injection INJECT 6MG  (ONE SINGLE DOSE AUTOINJECTOR) SUBCUTANEOUSLY TWICE A DAY AS NEEDED FOR SEVERE CLUSTER HEADACHE    . topiramate (TOPAMAX) 100 MG tablet TAKE ONE TABLET BY MOUTH TWICE A DAY AND TAKE TWO TABLETS AT BEDTIME (*NOTE DOSE)    . triamterene-hydrochlorothiazide (DYAZIDE) 37.5-25 MG capsule Take 1 capsule by mouth daily.     No current facility-administered medications for this visit.   Allergies: Allergies  Allergen Reactions  . Iodides Itching and Swelling  . Other Itching and Swelling  . Shrimp [Shellfish Allergy ] Hives and Rash   I reviewed his past medical history, social history, family history, and  environmental history and no significant changes have been reported from his previous visit.  Review of Systems  Constitutional:  Negative for appetite change, chills, fever and unexpected weight change.  HENT:  Negative for congestion and rhinorrhea.   Eyes:  Negative for itching.  Respiratory:  Negative for cough, chest tightness, shortness of breath and wheezing.   Gastrointestinal:  Negative for abdominal pain.  Skin:  Negative for rash.  Allergic/Immunologic: Positive for environmental allergies and food allergies.  Neurological:  Negative for headaches.    Objective: There were no vitals taken for this visit. There is no height or weight on file to calculate BMI. Physical Exam Vitals and nursing note reviewed.  Constitutional:      Appearance: Normal appearance. He is well-developed.  HENT:     Head: Normocephalic and atraumatic.     Right Ear: Tympanic membrane and external ear normal.     Left Ear: Tympanic membrane and external ear normal.     Nose: Nose normal.     Mouth/Throat:     Mouth: Mucous membranes are moist.     Pharynx: Oropharynx is clear.  Eyes:     Conjunctiva/sclera: Conjunctivae normal.  Cardiovascular:     Rate and Rhythm: Normal rate and regular rhythm.     Heart sounds: Normal heart sounds. No murmur heard. Pulmonary:     Effort: Pulmonary effort is normal.     Breath sounds: Normal breath sounds. No wheezing, rhonchi or rales.  Musculoskeletal:     Cervical back: Neck  supple.  Skin:    General: Skin is warm.     Findings: No rash.  Neurological:     Mental Status: He is alert and oriented to person, place, and time.  Psychiatric:        Behavior: Behavior normal.   Previous notes and tests were reviewed. The plan was reviewed with the patient/family, and all questions/concerned were addressed.  It was my pleasure to see Husein today and participate in his care. Please feel free to contact me with any questions or  concerns.  Sincerely,  Orlan Cramp, DO Allergy  & Immunology  [facility]  Central office: [phone] [facility] office: [phone]

## 2024-08-04 ENCOUNTER — Other Ambulatory Visit: Payer: Self-pay

## 2024-08-04 ENCOUNTER — Ambulatory Visit: Payer: No Typology Code available for payment source | Admitting: Allergy

## 2024-08-04 ENCOUNTER — Encounter: Payer: Self-pay | Admitting: Allergy

## 2024-08-04 VITALS — BP 120/88 | HR 70 | Temp 98.0°F | Resp 16 | Ht 68.0 in | Wt 258.9 lb

## 2024-08-04 DIAGNOSIS — J301 Allergic rhinitis due to pollen: Secondary | ICD-10-CM | POA: Diagnosis not present

## 2024-08-04 DIAGNOSIS — J3089 Other allergic rhinitis: Secondary | ICD-10-CM | POA: Diagnosis not present

## 2024-08-04 DIAGNOSIS — J3081 Allergic rhinitis due to animal (cat) (dog) hair and dander: Secondary | ICD-10-CM

## 2024-08-04 DIAGNOSIS — T7800XD Anaphylactic reaction due to unspecified food, subsequent encounter: Secondary | ICD-10-CM

## 2024-08-04 DIAGNOSIS — H1013 Acute atopic conjunctivitis, bilateral: Secondary | ICD-10-CM | POA: Diagnosis not present

## 2024-08-04 DIAGNOSIS — T7819XD Other adverse food reactions, not elsewhere classified, subsequent encounter: Secondary | ICD-10-CM

## 2024-08-04 MED ORDER — MONTELUKAST SODIUM 10 MG PO TABS: 10.0000 mg | ORAL_TABLET | Freq: Every day | ORAL | 3 refills | Status: AC

## 2024-08-04 MED ORDER — CETIRIZINE HCL 10 MG PO TABS: 10.0000 mg | ORAL_TABLET | Freq: Every day | ORAL | 3 refills | Status: AC

## 2024-08-04 MED ORDER — EPINEPHRINE 0.3 MG/0.3ML IJ SOAJ: 0.3000 mg | INTRAMUSCULAR | 2 refills | Status: AC | PRN

## 2024-08-04 NOTE — Patient Instructions (Addendum)
 Allergic rhinoconjunctivitis 2021 skin testing positive to grass, ragweed, weeds, trees, dust mites, cat, dog.  Borderline positive to mold.  Continue environmental control measures.  Continue Singulair  (montelukast ) 10mg  daily at night. Use over the counter antihistamines such as Zyrtec  (cetirizine ), Claritin (loratadine), Allegra (fexofenadine), or Xyzal (levocetirizine) daily as needed. May take twice a day during allergy  flares. May switch antihistamines every few months. Use Flonase  (fluticasone ) nasal spray 1 spray per nostril twice a day as needed for nasal congestion.  Use Atrovent  (ipratropium) 0.03% 1-2 sprays per nostril twice a day as needed for runny nose/drainage. Use olopatadine  eye drops 0.1% twice a day as needed for itchy/watery eyes.  May use refresh eye drops as needed additionally.  Continue allergy  injections - given today. Make sure you take your allergy  medications on the days of injection.  Food allergy  2021 skin testing positive to shrimp, crab, lobster, apple, cantaloupe, watermelon. Continue to avoid shellfish, apples, melons. For mild symptoms you can take over the counter antihistamines (zyrtec  10mg  to 20mg ) and monitor symptoms closely.  If symptoms worsen or if you have severe symptoms including breathing issues, throat closure, significant swelling, whole body hives, severe diarrhea and vomiting, lightheadedness then use epinephrine  and seek immediate medical care afterwards. Emergency action plan in place.  Follow up in 12 months or sooner if needed.

## 2024-08-06 ENCOUNTER — Encounter: Payer: Self-pay | Admitting: Allergy

## 2024-08-11 ENCOUNTER — Ambulatory Visit: Admitting: Physician Assistant

## 2024-09-01 ENCOUNTER — Ambulatory Visit (INDEPENDENT_AMBULATORY_CARE_PROVIDER_SITE_OTHER)

## 2024-09-01 DIAGNOSIS — J309 Allergic rhinitis, unspecified: Secondary | ICD-10-CM | POA: Diagnosis not present

## 2024-09-24 NOTE — Progress Notes (Deleted)
 "  Referring Physician:  Torress, Corean SQUIBB, PA-C [facility] [address] [place],  [place] [address]  Primary Physician:  Torress, Corean SQUIBB, PA-C  History of Present Illness: 09/24/2024 [name] is here today with a chief complaint of ***  Neck pain Numbness and tingling imbalance, weakness    Duration: *** Location: *** Quality: *** Severity: ***  Precipitating: aggravated by *** Modifying factors: made better by *** Weakness: none Timing: *** Bowel/Bladder Dysfunction: none  Conservative measures:  Physical therapy: *** Has participated in (when? Neck?) Multimodal medical therapy including regular antiinflammatories: *** none? Injections: *** epidural steroid injections 2017 Nerve block?  Past Surgery: ***  Kamarius Buckbee has ***no symptoms of cervical myelopathy.  The symptoms are causing a significant impact on the patient's life.   Review of Systems:  A 10 point review of systems is negative, except for the pertinent positives and negatives detailed in the HPI.  Past Medical History: Past Medical History:  Diagnosis Date   3rd cranial nerve palsy    AR (allergic rhinitis)    Depression    Hypertension    Migraine    Postconcussion syndrome    PTSD (post-traumatic stress disorder)    Recurrent upper respiratory infection (URI)    Seasonal and perennial allergic rhinoconjunctivitis 04/27/2020   Sleep apnea    TBI (traumatic brain injury) [facility])     Past Surgical History: Past Surgical History:  Procedure Laterality Date   SHOULDER ACROMIOPLASTY      Allergies: Allergies as of 09/28/2024 - Review Complete 08/06/2024  Allergen Reaction Noted   Iodides Itching and Swelling 10/21/2012   Other Itching and Swelling 09/28/2015   Shrimp [shellfish allergy ] Hives and Rash 12/08/2013    Medications: Outpatient Encounter Medications as of 09/28/2024  Medication Sig   Amantadine HCl 100 MG tablet Take 100 mg by mouth 2 (two) times daily.    cetirizine  (ZYRTEC  ALLERGY ) 10 MG tablet Take 1 tablet (10 mg total) by mouth daily.   EPINEPHrine  0.3 mg/0.3 mL IJ SOAJ injection Inject 0.3 mg into the muscle as needed for anaphylaxis.   Erenumab-aooe 140 MG/ML SOAJ Inject 140 mg into the skin.   fluticasone  (FLONASE ) 50 MCG/ACT nasal spray Place 1 spray into both nostrils 2 (two) times daily as needed (nasal congestion).   ipratropium (ATROVENT ) 0.03 % nasal spray Place 1-2 sprays into both nostrils 2 (two) times daily as needed (nasal drainage).   losartan (COZAAR) 25 MG tablet Take 1 tablet by mouth daily.   montelukast  (SINGULAIR ) 10 MG tablet Take 1 tablet (10 mg total) by mouth at bedtime.   olopatadine  (PATANOL) 0.1 % ophthalmic solution Place 1 drop into both eyes 2 (two) times daily.   promethazine (PHENERGAN) 25 MG tablet TAKE TWO TABLETS BY MOUTH TWICE A DAY AS NEEDED FOR NAUSEA OR SLEEP OFF   SUMAtriptan  (IMITREX ) 6 MG/0.5ML SOSY injection INJECT 6MG  (ONE SINGLE DOSE AUTOINJECTOR) SUBCUTANEOUSLY TWICE A DAY AS NEEDED FOR SEVERE CLUSTER HEADACHE   topiramate (TOPAMAX) 100 MG tablet TAKE ONE TABLET BY MOUTH TWICE A DAY AND TAKE TWO TABLETS AT BEDTIME (*NOTE DOSE) (Patient not taking: Reported on 08/04/2024)   triamterene-hydrochlorothiazide (DYAZIDE) 37.5-25 MG capsule Take 1 capsule by mouth daily.   No facility-administered encounter medications on file as of 09/28/2024.    Social History: Social History[1]  Family Medical History: Family History  Problem Relation Age of Onset   Eczema Mother    Allergic rhinitis Mother    Allergic rhinitis Sister  Asthma Sister     Physical Examination: @VITALWITHPAIN @  General: Patient is well developed, well nourished, calm, collected, and in no apparent distress. Attention to examination is appropriate.  Psychiatric: Patient is non-anxious.  Head:  Pupils equal, round, and reactive to light.  ENT:  Oral mucosa appears well hydrated.  Neck:   Supple.  ***Full range of  motion.  Respiratory: Patient is breathing without any difficulty.  Extremities: No edema.  Vascular: Palpable dorsal pedal pulses.  Skin:   On exposed skin, there are no abnormal skin lesions.  NEUROLOGICAL:     Awake, alert, oriented to person, place, and time.  Speech is clear and fluent. Fund of knowledge is appropriate.   Cranial Nerves: Pupils equal round and reactive to light.  Facial tone is symmetric.  Facial sensation is symmetric.  ROM of spine: ***full.  Palpation of spine: ***non tender.    Strength: Side Biceps Triceps Deltoid Interossei Grip Wrist Ext. Wrist Flex.  R 5 5 5 5 5 5 5   L 5 5 5 5 5 5 5    Side Iliopsoas Quads Hamstring PF DF EHL  R 5 5 5 5 5 5   L 5 5 5 5 5 5    Reflexes are ***2+ and symmetric at the biceps, triceps, brachioradialis, patella and achilles.   Hoffman's is absent.  Clonus is not present.  Toes are down-going.  Bilateral upper and lower extremity sensation is intact to light touch.    Gait is normal.   No difficulty with tandem gait.   No evidence of dysmetria noted.  Medical Decision Making  Imaging: ***  I have personally reviewed the images and agree with the above interpretation.  Assessment and Plan: [name] is a pleasant 44 y.o. male with ***    Thank you for involving me in the care of this patient.   I spent a total of *** minutes in both face-to-face and non-face-to-face activities for this visit on the date of this encounter.   Lyle Decamp, PA-C Dept. of Neurosurgery      [1]  Social History Tobacco Use   Smoking status: Former   Smokeless tobacco: Never  Vaping Use   Vaping status: Never Used  Substance Use Topics   Alcohol use: No   Drug use: No   "

## 2024-09-28 ENCOUNTER — Ambulatory Visit: Admitting: Physician Assistant

## 2024-10-04 ENCOUNTER — Ambulatory Visit

## 2024-10-04 DIAGNOSIS — J302 Other seasonal allergic rhinitis: Secondary | ICD-10-CM | POA: Diagnosis not present
